# Patient Record
Sex: Male | Born: 2000 | Race: Black or African American | Hispanic: No | Smoking: Current some day smoker
Health system: Southern US, Community
[De-identification: ages and names within clinical notes are randomized; demographics above are authoritative.]

## PROBLEM LIST (undated history)

## (undated) DIAGNOSIS — F32A Depression, unspecified: Secondary | ICD-10-CM

## (undated) DIAGNOSIS — F4310 Post-traumatic stress disorder, unspecified: Secondary | ICD-10-CM

## (undated) HISTORY — DX: Depression, unspecified: F32.A

## (undated) HISTORY — DX: Post-traumatic stress disorder, unspecified: F43.10

---
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
## 2018-06-17 ENCOUNTER — Other Ambulatory Visit: Payer: Self-pay

## 2018-06-17 ENCOUNTER — Emergency Department (HOSPITAL_COMMUNITY): Payer: Medicaid Other

## 2018-06-17 ENCOUNTER — Emergency Department (HOSPITAL_COMMUNITY)
Admission: EM | Admit: 2018-06-17 | Discharge: 2018-06-18 | Disposition: A | Discharge status: Home / Self Care | Payer: Medicaid Other | Attending: Emergency Medicine | Admitting: Emergency Medicine

## 2018-06-17 ENCOUNTER — Encounter (HOSPITAL_COMMUNITY): Payer: Self-pay | Admitting: Emergency Medicine

## 2018-06-17 DIAGNOSIS — Y9367 Activity, basketball: Secondary | ICD-10-CM | POA: Insufficient documentation

## 2018-06-17 DIAGNOSIS — Y998 Other external cause status: Secondary | ICD-10-CM | POA: Insufficient documentation

## 2018-06-17 DIAGNOSIS — Y929 Unspecified place or not applicable: Secondary | ICD-10-CM | POA: Insufficient documentation

## 2018-06-17 DIAGNOSIS — S63432A Traumatic rupture of volar plate of right middle finger at metacarpophalangeal and interphalangeal joint, initial encounter: Secondary | ICD-10-CM | POA: Insufficient documentation

## 2018-06-17 DIAGNOSIS — S63639A Sprain of interphalangeal joint of unspecified finger, initial encounter: Secondary | ICD-10-CM

## 2018-06-17 DIAGNOSIS — S6991XA Unspecified injury of right wrist, hand and finger(s), initial encounter: Secondary | ICD-10-CM | POA: Diagnosis present

## 2018-06-17 DIAGNOSIS — W501XXA Accidental kick by another person, initial encounter: Secondary | ICD-10-CM | POA: Diagnosis not present

## 2018-06-17 NOTE — ED Triage Notes (Signed)
Patient injured his right middle finger while playing basketball this evening , presents with pain/swelling at right middle finger/skin intact .

## 2018-06-18 NOTE — Discharge Instructions (Signed)
You have a very small fracture of your finger.  This should heal with splinting and rest.  Please follow-up with your doctor in 1 to 2 weeks to ensure that healing is occurring.

## 2018-06-18 NOTE — ED Notes (Signed)
See providers notes

## 2018-06-18 NOTE — ED Provider Notes (Signed)
[facility] EMERGENCY DEPARTMENT Provider Note   CSN: [id number] Arrival date & time: 06/17/18  2255     History   Chief Complaint Chief Complaint  Patient presents with  . Finger Injury    HPI Randy Stone is a 17 y.o. male.  Patient presents to the emergency department with a chief complaint of right middle finger injury.  He states that he had his finger kicked and jammed while playing basketball earlier this evening.  He complains of moderate pain.  The pain is worsened with palpation and movement.  He denies numbness or weakness.  Denies any other injuries.  He is tried nothing prior to arrival.  The history is provided by the patient. No language interpreter was used.    History reviewed. No pertinent past medical history.  There are no active problems to display for this patient.   History reviewed. No pertinent surgical history.      Home Medications    Prior to Admission medications   Medication Sig Start Date End Date Taking? Authorizing Provider  ibuprofen (ADVIL,MOTRIN) 400 MG tablet Take 1 tablet (400 mg total) by mouth every 6 (six) hours as needed. 06/23/16   [name], MD    Family History No family history on file.  Social History Social History   Tobacco Use  . Smoking status: Never Smoker  . Smokeless tobacco: Never Used  Substance Use Topics  . Alcohol use: No  . Drug use: Yes    Types: Marijuana     Allergies   Patient has no known allergies.   Review of Systems Review of Systems  All other systems reviewed and are negative.    Physical Exam Updated Vital Signs BP 131/73 (BP Location: Right Arm)   Pulse 67   Temp 98.3 F (36.8 C) (Oral)   Resp 16   SpO2 100%   Physical Exam  Constitutional: He is oriented to person, place, and time. He appears well-developed and well-nourished.  HENT:  Head: Normocephalic and atraumatic.  Eyes: Conjunctivae and EOM are normal.  Neck: Normal range of  motion.  Cardiovascular: Normal rate.  Pulmonary/Chest: Effort normal.  Abdominal: He exhibits no distension.  Musculoskeletal: Normal range of motion.  Moderate swelling about the right third finger at the PIP with tenderness to palpation, no obvious dislocation  Neurological: He is alert and oriented to person, place, and time.  Skin: Skin is dry.  Psychiatric: He has a normal mood and affect. His behavior is normal. Judgment and thought content normal.  Nursing note and vitals reviewed.    ED Treatments / Results  Labs (all labs ordered are listed, but only abnormal results are displayed) Labs Reviewed - No data to display  EKG None  Radiology Dg Finger Middle Right  Result Date: 06/17/2018 CLINICAL DATA:  Basketball injury to the right middle finger this evening. Pain and swelling. EXAM: RIGHT MIDDLE FINGER 2+V COMPARISON:  None. FINDINGS: Soft tissue swelling over the proximal interphalangeal joint region of the right third finger. Displaced volar plate injury at the proximal aspect of the middle phalanx. No dislocation at the joint. Joint spaces are preserved. IMPRESSION: Displaced volar plate fracture at the proximal interphalangeal joint of the right third finger with associated soft tissue swelling. Electronically Signed   By: Burman Nieves M.D.   On: 06/17/2018 23:51    Procedures Procedures (including critical care time)  Medications Ordered in ED Medications - No data to display   Initial Impression / Assessment  and Plan / ED Course  I have reviewed the triage vital signs and the nursing notes.  Pertinent labs & imaging results that were available during my care of the patient were reviewed by me and considered in my medical decision making (see chart for details).    Patient with jammed finger.  He has a volar plate injury.  We will splint.  Recommend PCP follow-up.  Final Clinical Impressions(s) / ED Diagnoses   Final diagnoses:  Volar plate injury of  finger, initial encounter    ED Discharge Orders    None       Roxy HorsemanBrowning, Layann Bluett, PA-C 06/18/18 0117    Palumbo, April, MD 06/18/18 0120

## 2018-06-18 NOTE — ED Notes (Signed)
Patient verbalizes understanding of discharge instructions. Opportunity for questioning and answers were provided. Armband removed by staff, pt discharged from ED to home via POV  

## 2018-10-13 ENCOUNTER — Emergency Department (HOSPITAL_COMMUNITY)
Admission: EM | Admit: 2018-10-13 | Discharge: 2018-10-13 | Disposition: A | Discharge status: Home / Self Care | Payer: Medicaid Other | Attending: Emergency Medicine | Admitting: Emergency Medicine

## 2018-10-13 DIAGNOSIS — Z79899 Other long term (current) drug therapy: Secondary | ICD-10-CM | POA: Insufficient documentation

## 2018-10-13 DIAGNOSIS — R110 Nausea: Secondary | ICD-10-CM | POA: Diagnosis present

## 2018-10-13 DIAGNOSIS — F12188 Cannabis abuse with other cannabis-induced disorder: Secondary | ICD-10-CM | POA: Insufficient documentation

## 2018-10-13 LAB — URINALYSIS, ROUTINE W REFLEX MICROSCOPIC
Bilirubin Urine: NEGATIVE
Glucose, UA: NEGATIVE mg/dL
Hgb urine dipstick: NEGATIVE
Ketones, ur: 80 mg/dL — AB
Leukocytes, UA: NEGATIVE
Nitrite: NEGATIVE
Protein, ur: 30 mg/dL — AB
Specific Gravity, Urine: 1.032 — ABNORMAL HIGH (ref 1.005–1.030)
pH: 6 (ref 5.0–8.0)

## 2018-10-13 LAB — COMPREHENSIVE METABOLIC PANEL
ALT: 18 U/L (ref 0–44)
AST: 28 U/L (ref 15–41)
Albumin: 4.7 g/dL (ref 3.5–5.0)
Alkaline Phosphatase: 66 U/L (ref 38–126)
Anion gap: 16 — ABNORMAL HIGH (ref 5–15)
BUN: 13 mg/dL (ref 6–20)
CO2: 20 mmol/L — ABNORMAL LOW (ref 22–32)
Calcium: 10 mg/dL (ref 8.9–10.3)
Chloride: 104 mmol/L (ref 98–111)
Creatinine, Ser: 1.24 mg/dL (ref 0.61–1.24)
GFR calc Af Amer: >60 mL/min (ref >60)
GFR calc non Af Amer: >60 mL/min (ref >60)
Glucose, Bld: 93 mg/dL (ref 70–99)
Potassium: 4.7 mmol/L (ref 3.5–5.1)
Sodium: 140 mmol/L (ref 135–145)
Total Bilirubin: 2 mg/dL — ABNORMAL HIGH (ref 0.3–1.2)
Total Protein: 7.7 g/dL (ref 6.5–8.1)

## 2018-10-13 LAB — CBC WITH DIFFERENTIAL/PLATELET
Abs Immature Granulocytes: 0 10*3/uL (ref 0.00–0.07)
Basophils Absolute: 0 10*3/uL (ref 0.0–0.1)
Basophils Relative: 0 %
Eosinophils Absolute: 0 10*3/uL (ref 0.0–0.5)
Eosinophils Relative: 0 %
HCT: 40.3 % (ref 39.0–52.0)
HEMOGLOBIN: 13.3 g/dL (ref 13.0–17.0)
LYMPHS ABS: 0.9 10*3/uL (ref 0.7–4.0)
LYMPHS PCT: 11 %
MCH: 25.4 pg — ABNORMAL LOW (ref 26.0–34.0)
MCHC: 33 g/dL (ref 30.0–36.0)
MCV: 77.1 fL — ABNORMAL LOW (ref 80.0–100.0)
MONOS PCT: 4 %
Monocytes Absolute: 0.3 10*3/uL (ref 0.1–1.0)
Neutro Abs: 6.7 10*3/uL (ref 1.7–7.7)
Neutrophils Relative %: 85 %
Platelets: 157 10*3/uL (ref 150–400)
RBC: 5.23 MIL/uL (ref 4.22–5.81)
RDW: 12.8 % (ref 11.5–15.5)
WBC: 7.9 10*3/uL (ref 4.0–10.5)
nRBC: 0 % (ref 0.0–0.2)
nRBC: 0 /100 WBC

## 2018-10-13 LAB — LIPASE, BLOOD: Lipase: 25 U/L (ref 11–51)

## 2018-10-13 MED ORDER — SODIUM CHLORIDE 0.9 % IV BOLUS
2000.0000 mL | Freq: Once | INTRAVENOUS | Status: AC
  Administered 2018-10-13: 1000 mL via INTRAVENOUS

## 2018-10-13 MED ORDER — ONDANSETRON HCL 4 MG/2ML IJ SOLN
4.0000 mg | Freq: Once | INTRAMUSCULAR | Status: AC
  Administered 2018-10-13: 4 mg via INTRAVENOUS
  Filled 2018-10-13: qty 2

## 2018-10-13 MED ORDER — ONDANSETRON 4 MG PO TBDP: 4.0000 mg | ORAL_TABLET | Freq: Three times a day (TID) | ORAL | 0 refills | Status: DC | PRN

## 2018-10-13 NOTE — ED Notes (Signed)
Patient has been provided with gatorade for fluid challenge.  No reports of emesis since receiving zofran.

## 2018-10-13 NOTE — ED Provider Notes (Signed)
[facility]ENC[facility] Provider Note   CSN: [id number] Arrival date & time: 10/13/18  1351     History   Chief Complaint Chief Complaint  Patient presents with  . Nausea    HPI Henreitta Leberaziah J Bohman is a 18 y.o. male.  18 year old male with history of cannabis hyperemesis  syndrome, presents with persistent nausea and vomiting.  Patient was recently admitted in [place] 2 weeks ago for intractable vomiting and dehydration for the same.  Patient reports he was smoking marijuana multiple times per day while in [place].  Had several ED presentations there and received nausea medicines and fluids.  He reports on third visit to the ED he was hospitalized for dehydration.  He has not had fever.  No watery diarrhea.   [place] [facility] [place] and was vomiting on day of his return.  Had been doing well for approximately 1 week but nausea and vomiting returned yesterday.  Still using marijuana but reports now once per day us. Reports he is having multiple episodes of emesis per day.  Reports it is difficult for him to pass a bowel movement and sometimes it is slightly loose when he is able to pass some stool.  Reports mild upper abdominal pain that feels mostly like nausea and "queasiness".  No dysuria.  No testicular pain.  He is on no chronic medications. No prior abdominal surgeries.  The history is provided by the patient.    No past medical history on file.  There are no active problems to display for this patient.   No past surgical history on file.      Home Medications    Prior to Admission medications   Medication Sig Start Date End Date Taking? Authorizing Provider  ibuprofen (ADVIL,MOTRIN) 400 MG tablet Take 1 tablet (400 mg total) by mouth every 6 (six) hours as needed. 06/23/16   Gwyneth SproutPlunkett, [name], MD  ondansetron (ZOFRAN ODT) 4 MG disintegrating tablet Take 1 tablet (4 mg total) by mouth every 8 (eight) hours as needed for nausea or  vomiting. 10/13/18   Ree Shayeis, Jilberto Vanderwall, MD    Family History No family history on file.  Social History Social History   Tobacco Use  . Smoking status: Never Smoker  . Smokeless tobacco: Never Used  Substance Use Topics  . Alcohol use: No  . Drug use: Yes    Types: Marijuana     Allergies   Patient has no known allergies.   Review of Systems Review of Systems  All systems reviewed and were reviewed and were negative except as stated in the HPI  Physical Exam Updated Vital Signs BP 113/65 (BP Location: Right Arm)   Pulse 97   Temp 97.8 F (36.6 C) (Oral)   Resp 14   SpO2 100%   Physical Exam Vitals signs and nursing note reviewed.  Constitutional:      General: He is not in acute distress.    Appearance: He is well-developed.     Comments: Actively vomiting and dry heaving during assessment  HENT:     Head: Normocephalic and atraumatic.     Nose: Nose normal.  Eyes:     Conjunctiva/sclera: Conjunctivae normal.     Pupils: Pupils are equal, round, and reactive to light.  Neck:     Musculoskeletal: Normal range of motion and neck supple.  Cardiovascular:     Rate and Rhythm: Normal rate and regular rhythm.     Heart sounds: Normal  heart sounds. No murmur. No friction rub. No gallop.   Pulmonary:     Effort: Pulmonary effort is normal. No respiratory distress.     Breath sounds: Normal breath sounds. No wheezing or rales.  Abdominal:     General: Bowel sounds are normal.     Palpations: Abdomen is soft.     Tenderness: There is abdominal tenderness. There is no guarding or rebound.     Comments: Soft and nondistended, mild epigastric tenderness, no lower abdominal tenderness, no guarding or peritoneal signs, no hernias  Genitourinary:    Penis: Normal.      Comments: Testicles normal bilaterally, no scrotal swelling or tenderness, no inguinal hernias Skin:    General: Skin is warm and dry.     Capillary Refill: Capillary refill takes less than 2 seconds.      Findings: No rash.  Neurological:     General: No focal deficit present.     Mental Status: He is alert and oriented to person, place, and time.     Cranial Nerves: No cranial nerve deficit.     Comments: Normal strength 5/5 in upper and lower extremities      ED Treatments / Results  Labs (all labs ordered are listed, but only abnormal results are displayed) Labs Reviewed  COMPREHENSIVE METABOLIC PANEL - Abnormal; Notable for the following components:      Result Value   CO2 20 (*)    Total Bilirubin 2.0 (*)    Anion gap 16 (*)    All other components within normal limits  CBC WITH DIFFERENTIAL/PLATELET - Abnormal; Notable for the following components:   MCV 77.1 (*)    MCH 25.4 (*)    All other components within normal limits  URINALYSIS, ROUTINE W REFLEX MICROSCOPIC - Abnormal; Notable for the following components:   Specific Gravity, Urine 1.032 (*)    Ketones, ur 80 (*)    Protein, ur 30 (*)    Bacteria, UA RARE (*)    All other components within normal limits  LIPASE, BLOOD    EKG None  Radiology No results found.  Procedures Procedures (including critical care time)  Medications Ordered in ED Medications  ondansetron (ZOFRAN) injection 4 mg (4 mg Intravenous Given 10/13/18 1440)  sodium chloride 0.9 % bolus 2,000 mL (0 mLs Intravenous Stopped 10/13/18 1730)     Initial Impression / Assessment and Plan / ED Course  I have reviewed the triage vital signs and the nursing notes.  Pertinent labs & imaging results that were available during my care of the patient were reviewed by me and considered in my medical decision making (see chart for details).    18 year old male with history of chronic marijuana use and recent admission 2 weeks ago in [place] for persistent vomiting consistent with cannabis hyperemesis syndrome, returns to the ED today for nausea and vomiting.  Symptoms returned yesterday.  No associated fever or diarrhea.  On exam here afebrile with normal  vitals.  Throat benign, lungs clear with symmetric breath sounds.  Abdomen with mild epigastric tenderness but no guarding or peritoneal signs.  No right lower quadrant tenderness.  GU exam normal.  We will give to IV fluid boluses here and check labs to include CBC CMP lipase.  Will give IV Zofran and reassess.  4pm: After zofran, no further vomiting, IVF infusion. Labs pending.   5pm: Patient reports feeling much better after Zofran and IV fluids.  Still no further vomiting.  Will give fluid trial.  CMP with normal electrolytes, bicarb 20, glucose 93.  BUN 13, creatinine is upper limit of normal for age at 1.24 likely from dehydration but will send urinalysis.  Lipase normal.  CBC pending.  Signed out to Dr. Hardie Pulleyalder at end of shift. If urine reassuring and tolerates fluid trial. Anticipate can be discharged with Zofran for as needed use.  Counseled patient and family at length on the effects of cannabis hyperemesis syndrome and strongly advised he discontinue use of cannabis.  Also advised PCP follow-up in 1 week to recheck creatinine.  Final Clinical Impressions(s) / ED Diagnoses   Final diagnoses:  Cannabis hyperemesis syndrome concurrent with and due to cannabis abuse [facility](HCC)    ED Discharge Orders         Ordered    ondansetron (ZOFRAN ODT) 4 MG disintegrating tablet  Every 8 hours PRN     10/13/18 1722           Ree Shayeis, Kitzia Camus, MD 10/13/18 2119

## 2018-10-13 NOTE — Discharge Instructions (Signed)
We strongly advise that you discontinue all use of marijuana as this is a frequent cause of cyclical vomiting as you are experiencing.  You had dehydration today which also had effect on your kidney.  Rest and drink plenty of fluids over the next few days.  May use Zofran as needed for any additional vomiting.  Follow-up with your regular doctor in 1 week to have your kidney tests called the creatinine repeated.  Return sooner for repetitive vomiting with inability to keep down fluids, worsen abdominal pain or new concerns.

## 2018-10-13 NOTE — ED Notes (Signed)
Attempted to obtain labs with no success on drawback.  MD made aware.  Lipase and CMP obtained and sent to lab.

## 2018-10-13 NOTE — ED Triage Notes (Signed)
Pt in c/o n/v/d onset today with hx of the same, pt reports recent hospitalization for similar symptoms, pt reports 15-20 vomiting episodes today, pt reports use of marijuana, A&O x4

## 2019-01-22 ENCOUNTER — Other Ambulatory Visit: Payer: Self-pay

## 2019-01-22 ENCOUNTER — Encounter (HOSPITAL_COMMUNITY): Payer: Self-pay | Admitting: Obstetrics and Gynecology

## 2019-01-22 ENCOUNTER — Emergency Department (HOSPITAL_COMMUNITY)
Admission: EM | Admit: 2019-01-22 | Discharge: 2019-01-22 | Disposition: A | Discharge status: Home / Self Care | Payer: Medicaid Other | Attending: Emergency Medicine | Admitting: Emergency Medicine

## 2019-01-22 DIAGNOSIS — F12188 Cannabis abuse with other cannabis-induced disorder: Secondary | ICD-10-CM | POA: Insufficient documentation

## 2019-01-22 DIAGNOSIS — R112 Nausea with vomiting, unspecified: Secondary | ICD-10-CM | POA: Diagnosis present

## 2019-01-22 LAB — CBC WITH DIFFERENTIAL/PLATELET
Abs Immature Granulocytes: 0.02 10*3/uL (ref 0.00–0.07)
Basophils Absolute: 0.1 10*3/uL (ref 0.0–0.1)
Basophils Relative: 1 %
Eosinophils Absolute: 0 10*3/uL (ref 0.0–0.5)
Eosinophils Relative: 0 %
HCT: 45.8 % (ref 39.0–52.0)
Hemoglobin: 15.7 g/dL (ref 13.0–17.0)
Immature Granulocytes: 0 %
Lymphocytes Relative: 13 %
Lymphs Abs: 1 10*3/uL (ref 0.7–4.0)
MCH: 26.7 pg (ref 26.0–34.0)
MCHC: 34.3 g/dL (ref 30.0–36.0)
MCV: 77.9 fL — ABNORMAL LOW (ref 80.0–100.0)
Monocytes Absolute: 0.3 10*3/uL (ref 0.1–1.0)
Monocytes Relative: 3 %
Neutro Abs: 6.7 10*3/uL (ref 1.7–7.7)
Neutrophils Relative %: 83 %
Platelets: 201 10*3/uL (ref 150–400)
RBC: 5.88 MIL/uL — ABNORMAL HIGH (ref 4.22–5.81)
RDW: 13.5 % (ref 11.5–15.5)
WBC: 8.1 10*3/uL (ref 4.0–10.5)
nRBC: 0 % (ref 0.0–0.2)

## 2019-01-22 LAB — COMPREHENSIVE METABOLIC PANEL
ALT: 29 U/L (ref 0–44)
AST: 21 U/L (ref 15–41)
Albumin: 4.6 g/dL (ref 3.5–5.0)
Alkaline Phosphatase: 66 U/L (ref 38–126)
Anion gap: 8 (ref 5–15)
BUN: 12 mg/dL (ref 6–20)
CO2: 27 mmol/L (ref 22–32)
Calcium: 9.4 mg/dL (ref 8.9–10.3)
Chloride: 102 mmol/L (ref 98–111)
Creatinine, Ser: 0.84 mg/dL (ref 0.61–1.24)
GFR calc Af Amer: >60 mL/min (ref >60)
GFR calc non Af Amer: >60 mL/min (ref >60)
Glucose, Bld: 89 mg/dL (ref 70–99)
Potassium: 3.9 mmol/L (ref 3.5–5.1)
Sodium: 137 mmol/L (ref 135–145)
Total Bilirubin: 0.9 mg/dL (ref 0.3–1.2)
Total Protein: 7.8 g/dL (ref 6.5–8.1)

## 2019-01-22 LAB — URINALYSIS, ROUTINE W REFLEX MICROSCOPIC
Bilirubin Urine: NEGATIVE
Glucose, UA: NEGATIVE mg/dL
Hgb urine dipstick: NEGATIVE
Ketones, ur: 5 mg/dL — AB
Leukocytes,Ua: NEGATIVE
Nitrite: NEGATIVE
Protein, ur: NEGATIVE mg/dL
Specific Gravity, Urine: 1.013 (ref 1.005–1.030)
pH: 9 — ABNORMAL HIGH (ref 5.0–8.0)

## 2019-01-22 LAB — LIPASE, BLOOD: Lipase: 24 U/L (ref 11–51)

## 2019-01-22 MED ORDER — ONDANSETRON 4 MG PO TBDP: 4.0000 mg | ORAL_TABLET | Freq: Three times a day (TID) | ORAL | 0 refills | Status: DC | PRN

## 2019-01-22 MED ORDER — SODIUM CHLORIDE 0.9 % IV BOLUS
1000.0000 mL | Freq: Once | INTRAVENOUS | Status: AC
  Administered 2019-01-22: 1000 mL via INTRAVENOUS

## 2019-01-22 NOTE — ED Triage Notes (Signed)
Per EMS: Pt is coming in for N/V after smoking weed last night. Pt alert and oriented.

## 2019-01-22 NOTE — ED Provider Notes (Signed)
[facility]-EMERGENCY DEPT Provider Note   CSN: [id number] Arrival date & time: 01/22/19  1132    History   Chief Complaint Chief Complaint  Patient presents with  . Nausea  . Emesis  . Headache    HPI Randy Stone is a 18 y.o. male.     18yo male brought in by EMS for nausea and vomiting since yesterday. Patient reports onset of symptoms after smoking marijuana and eating fried oreos. Denies abdominal pain, reports he has been constipated. Denies fevers or chills. No known sick contacts. No other complaints or concerns. Given Morphine and Zofran by EMS with some relief upon arrival in the ER.      No past medical history on file.  There are no active problems to display for this patient.   No past surgical history on file.      Home Medications    Prior to Admission medications   Medication Sig Start Date End Date Taking? Authorizing Provider  ondansetron (ZOFRAN ODT) 4 MG disintegrating tablet Take 1 tablet (4 mg total) by mouth every 8 (eight) hours as needed for nausea or vomiting. 01/22/19   Jeannie Fend, PA-C    Family History No family history on file.  Social History Social History   Tobacco Use  . Smoking status: Never Smoker  . Smokeless tobacco: Never Used  Substance Use Topics  . Alcohol use: No  . Drug use: Yes    Types: Marijuana     Allergies   Patient has no known allergies.   Review of Systems Review of Systems  Constitutional: Negative for chills and fever.  Gastrointestinal: Positive for constipation, nausea and vomiting. Negative for abdominal pain and diarrhea.  Genitourinary: Negative for enuresis, frequency and urgency.  Musculoskeletal: Negative for arthralgias and myalgias.  Skin: Negative for rash and wound.  Allergic/Immunologic: Negative for immunocompromised state.  Neurological: Positive for headaches. Negative for weakness.  Psychiatric/Behavioral: Negative for confusion.  All other  systems reviewed and are negative.    Physical Exam Updated Vital Signs BP 136/72   Pulse 74   Temp 98.1 F (36.7 C) (Oral)   Resp 18   SpO2 98%   Physical Exam Vitals signs and nursing note reviewed.  Constitutional:      General: He is not in acute distress.    Appearance: He is well-developed. He is not diaphoretic.  HENT:     Head: Normocephalic and atraumatic.     Mouth/Throat:     Mouth: Mucous membranes are moist.  Cardiovascular:     Rate and Rhythm: Normal rate and regular rhythm.     Heart sounds: Normal heart sounds. No murmur.  Pulmonary:     Effort: Pulmonary effort is normal.     Breath sounds: Normal breath sounds.  Abdominal:     General: Abdomen is flat.     Palpations: Abdomen is soft.     Tenderness: There is no abdominal tenderness.  Skin:    General: Skin is warm and dry.     Findings: No rash.  Neurological:     Mental Status: He is alert and oriented to person, place, and time.  Psychiatric:        Behavior: Behavior normal.      ED Treatments / Results  Labs (all labs ordered are listed, but only abnormal results are displayed) Labs Reviewed  URINALYSIS, ROUTINE W REFLEX MICROSCOPIC - Abnormal; Notable for the following components:      Result Value  pH 9.0 (*)    Ketones, ur 5 (*)    All other components within normal limits  CBC WITH DIFFERENTIAL/PLATELET  COMPREHENSIVE METABOLIC PANEL  LIPASE, BLOOD    EKG None  Radiology No results found.  Procedures Procedures (including critical care time)  Medications Ordered in ED Medications  sodium chloride 0.9 % bolus 1,000 mL (0 mLs Intravenous Stopped 01/22/19 1501)     Initial Impression / Assessment and Plan / ED Course  I have reviewed the triage vital signs and the nursing notes.  Pertinent labs & imaging results that were available during my care of the patient were reviewed by me and considered in my medical decision making (see chart for details).  Clinical Course  as of Jan 22 1511  Sat Jan 22, 2019  [id number] 18 year old male brought in by EMS for nausea and vomiting after smoking marijuana.  Patient has a history of similar episodes previously.  Patient was given Zofran and morphine by EMS, no longer vomiting upon arrival.  Patient denies fevers or abdominal pain, changes in bowel habits.  On exam patient is well-appearing, abdomen is soft and nontender.  Patient was given IV fluids, awaiting lab evaluation.   [LM]  1511 Care signed out at change of shift pending labs.   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

## 2019-01-22 NOTE — ED Notes (Signed)
PT reports that he has vomited 3 x in the past 24 hours, with some associated diarrhea. Pt also c/o upper middle abdominal "discomfort", " feeling like i needs to eat".

## 2019-01-22 NOTE — Discharge Instructions (Signed)
Take Zofran as needed as prescribed for nausea and vomiting. Stop smoking marijuana to prevent future vomiting episodes.

## 2019-01-22 NOTE — ED Provider Notes (Signed)
Care transferred at shift change from [facility]. See note for Full HPI.  In summation, 18 year old male brought in by EMS for nausea and vomiting after smoking marijuana.  Had similar episodes of emesis after THC use.  Given morphine and Zofran by EMS.  Patient without any episodes of emesis in department.  He denies any fevers, abdominal pain, chest pain, shortness of breath, dysuria, diarrhea.  Patient had soft, nontender abdomen.  Nonsurgical abdomen.  Patient given IV fluids.  Plan for labs, urine.  Per previous provider likely cannabinoid hyperemesis.  Plan for DC home if normal labs.  Zofran sent by previous provider department to pharmacy.  Physical Exam  BP (!) 131/92   Pulse 61   Temp 98.1 F (36.7 C) (Oral)   Resp 16   SpO2 99%   Physical Exam Vitals signs and nursing note reviewed.  Constitutional:      General: He is not in acute distress.    Appearance: He is well-developed. He is not diaphoretic.  HENT:     Head: Atraumatic.  Eyes:     Pupils: Pupils are equal, round, and reactive to light.  Neck:     Musculoskeletal: Normal range of motion and neck supple.  Cardiovascular:     Rate and Rhythm: Normal rate and regular rhythm.  Pulmonary:     Effort: Pulmonary effort is normal. No respiratory distress.  Abdominal:     General: There is no distension.     Palpations: Abdomen is soft.     Comments: Soft, non tender without rebound or guarding. Normoactive bowel sounds. No overlying skin changes to abd wall. No abd wall herniations.  Musculoskeletal: Normal range of motion.  Skin:    General: Skin is warm and dry.  Neurological:     Mental Status: He is alert.     ED Course/Procedures   Clinical Course as of Jan 21 1553  Sat Jan 22, 2019  [id number] 18 year old male brought in by EMS for nausea and vomiting after smoking marijuana.  Patient has a history of similar episodes previously.  Patient was given Zofran and morphine by EMS, no longer vomiting upon arrival.   Patient denies fevers or abdominal pain, changes in bowel habits.  On exam patient is well-appearing, abdomen is soft and nontender.  Patient was given IV fluids, awaiting lab evaluation.   [LM]  1511 Care signed out at change of shift pending labs.   [LM]    Clinical Course User Index [LM] Jeannie Fend, PA-C    Procedures Labs Reviewed  CBC WITH DIFFERENTIAL/PLATELET - Abnormal; Notable for the following components:      Result Value   RBC 5.88 (*)    MCV 77.9 (*)    All other components within normal limits  URINALYSIS, ROUTINE W REFLEX MICROSCOPIC - Abnormal; Notable for the following components:   pH 9.0 (*)    Ketones, ur 5 (*)    All other components within normal limits  COMPREHENSIVE METABOLIC PANEL  LIPASE, BLOOD   MDM  18 year old male presents for evaluation nausea and emesis after using THC.  Care transferred from previous provider at shift change.  Patient without episodes of emesis in department.  Previous episodes of nonbloody, nonbilious.  Abdomen soft.  Nontender, nonsurgical abdomen.  Plan for labs, urine, IV fluids and reevaluate.  1550: Labs personally reviewed: CBC: Without leukocytosis, hemoglobin 15.7 CMP: Without electrolyte, renal or liver abnormality Lipase: 24 Urinalysis: No evidence of UTI, ketonuria, likely 2/2/ dehydration from emesis.  On reevaluation abdomen soft, nontender without rebound or guarding.  Non surgical abdomen.  Patient tolerating p.o. intake in department without difficulty.  He has had no episodes of emesis in department. Patient is nontoxic, nonseptic appearing, in no apparent distress.  Patient's pain and other symptoms adequately managed in emergency department.  Fluid bolus given.  Labs, imaging and vitals reviewed.  Patient does not meet the SIRS or Sepsis criteria.  On repeat exam patient does not have a surgical abdomin and there are no peritoneal signs.  No indication of appendicitis, bowel obstruction, bowel perforation,  cholecystitis, diverticulitis.  Patient discharged home with symptomatic treatment and given strict instructions for follow-up with their primary care physician.  I have also discussed reasons to return immediately to the ER.  Patient expresses understanding and agrees with plan.   1. Cannabinoid hyperemesis     Revel Stellmach A, PA-C 01/22/19 1554    Virgina NorfolkCuratolo, Adam, DO 01/22/19 1628

## 2019-04-22 ENCOUNTER — Emergency Department (HOSPITAL_COMMUNITY)
Admission: EM | Admit: 2019-04-22 | Discharge: 2019-04-22 | Disposition: A | Discharge status: Home / Self Care | Payer: Medicaid Other | Attending: Emergency Medicine | Admitting: Emergency Medicine

## 2019-04-22 DIAGNOSIS — F1210 Cannabis abuse, uncomplicated: Secondary | ICD-10-CM | POA: Diagnosis not present

## 2019-04-22 DIAGNOSIS — F329 Major depressive disorder, single episode, unspecified: Secondary | ICD-10-CM | POA: Diagnosis not present

## 2019-04-22 DIAGNOSIS — R112 Nausea with vomiting, unspecified: Secondary | ICD-10-CM | POA: Diagnosis not present

## 2019-04-22 DIAGNOSIS — F32A Depression, unspecified: Secondary | ICD-10-CM

## 2019-04-22 MED ORDER — SODIUM CHLORIDE 0.9 % IV SOLN
INTRAVENOUS | Status: DC
  Administered 2019-04-22: 10:00:00 via INTRAVENOUS

## 2019-04-22 MED ORDER — ONDANSETRON HCL 8 MG PO TABS: 8.0000 mg | ORAL_TABLET | Freq: Three times a day (TID) | ORAL | 0 refills | Status: DC | PRN

## 2019-04-22 MED ORDER — CAPSAICIN 0.075 % EX CREA
TOPICAL_CREAM | Freq: Two times a day (BID) | CUTANEOUS | Status: DC
  Administered 2019-04-22: 10:00:00 via TOPICAL
  Filled 2019-04-22: qty 60

## 2019-04-22 MED ORDER — SODIUM CHLORIDE 0.9 % IV BOLUS
500.0000 mL | Freq: Once | INTRAVENOUS | Status: AC
  Administered 2019-04-22: 500 mL via INTRAVENOUS

## 2019-04-22 MED ORDER — HALOPERIDOL LACTATE 5 MG/ML IJ SOLN
2.0000 mg | Freq: Once | INTRAMUSCULAR | Status: AC
  Administered 2019-04-22: 2 mg via INTRAVENOUS
  Filled 2019-04-22: qty 1

## 2019-04-22 NOTE — ED Notes (Signed)
Patient ambulated to bathroom with no assistance.

## 2019-04-22 NOTE — ED Notes (Signed)
EDP at bedside  

## 2019-04-22 NOTE — ED Provider Notes (Signed)
[facility]-EMERGENCY DEPT Provider Note   CSN: [id number] Arrival date & time: 04/22/19  0800    History   Chief Complaint Chief Complaint  Patient presents with  . Emesis    HPI Randy Stone is a 18 y.o. male.     HPI   He presents for evaluation nausea and vomiting, on and off since yesterday evening.  He also is sad and having episodes of crying because his family members, "do not like me."  He states that recently he got a new job and no one congratulated him and that made him feel bad.  He states he smokes marijuana to improve his outlook on life.  He understands that it could be causing his nausea and vomiting.  He denies suicidal ideation.  There are no other known modifying factors.  No past medical history on file.  There are no active problems to display for this patient.   No past surgical history on file.      Home Medications    Prior to Admission medications   Medication Sig Start Date End Date Taking? Authorizing Provider  ondansetron (ZOFRAN ODT) 4 MG disintegrating tablet Take 1 tablet (4 mg total) by mouth every 8 (eight) hours as needed for nausea or vomiting. Patient not taking: Reported on 04/22/2019 01/22/19   Army MeliaMurphy, Laura A, PA-C  ondansetron [facility](ZOFRAN) 8 MG tablet Take 1 tablet (8 mg total) by mouth every 8 (eight) hours as needed for nausea or vomiting. 04/22/19   Mancel BaleWentz, Dashel Goines, MD    Family History No family history on file.  Social History Social History   Tobacco Use  . Smoking status: Never Smoker  . Smokeless tobacco: Never Used  Substance Use Topics  . Alcohol use: No  . Drug use: Yes    Types: Marijuana     Allergies   Patient has no known allergies.   Review of Systems Review of Systems  All other systems reviewed and are negative.    Physical Exam Updated Vital Signs BP 121/71   Pulse (!) 51   Temp (!) 97.5 F (36.4 C) (Oral)   Resp 16   Ht 6' (1.829 m)   Wt 59 kg   SpO2 100%   BMI  17.63 kg/m   Physical Exam Vitals signs and nursing note reviewed.  Constitutional:      General: He is not in acute distress.    Appearance: Normal appearance. He is well-developed. He is not ill-appearing, toxic-appearing or diaphoretic.  HENT:     Head: Normocephalic and atraumatic.     Right Ear: External ear normal.     Left Ear: External ear normal.  Eyes:     Conjunctiva/sclera: Conjunctivae normal.     Pupils: Pupils are equal, round, and reactive to light.  Neck:     Musculoskeletal: Normal range of motion and neck supple.     Trachea: Phonation normal.  Cardiovascular:     Rate and Rhythm: Normal rate and regular rhythm.     Heart sounds: Normal heart sounds.  Pulmonary:     Effort: Pulmonary effort is normal.     Breath sounds: Normal breath sounds.  Abdominal:     Palpations: Abdomen is soft.     Tenderness: There is no abdominal tenderness.  Musculoskeletal: Normal range of motion.  Skin:    General: Skin is warm and dry.  Neurological:     Mental Status: He is alert and oriented to person, place, and time.  Cranial Nerves: No cranial nerve deficit.     Sensory: No sensory deficit.     Motor: No abnormal muscle tone.     Coordination: Coordination normal.  Psychiatric:        Behavior: Behavior normal.        Thought Content: Thought content normal.        Judgment: Judgment normal.     Comments: Sad and tearful during evaluation.      ED Treatments / Results  Labs (all labs ordered are listed, but only abnormal results are displayed) Labs Reviewed - No data to display  EKG None  Radiology No results found.  Procedures Procedures (including critical care time)  Medications Ordered in ED Medications  0.9 %  sodium chloride infusion ( Intravenous New Bag/Given 04/22/19 0936)  capsicum (ZOSTRIX) 0.075 % cream ( Topical Given 04/22/19 0932)  sodium chloride 0.9 % bolus 500 mL (0 mLs Intravenous Stopped 04/22/19 0930)  haloperidol lactate  (HALDOL) injection 2 mg (2 mg Intravenous Given 04/22/19 0842)     Initial Impression / Assessment and Plan / ED Course  I have reviewed the triage vital signs and the nursing notes.  Pertinent labs & imaging results that were available during my care of the patient were reviewed by me and considered in my medical decision making (see chart for details).         Patient Vitals for the past 24 hrs:  BP Temp Temp src Pulse Resp SpO2 Height Weight  04/22/19 1030 121/71 - - (!) 51 16 100 % - -  04/22/19 1000 (!) 100/51 - - - - - - -  04/22/19 0931 (!) 97/55 - - 62 16 98 % - -  04/22/19 0830 (!) 97/52 - - (!) 51 15 100 % - -  04/22/19 0815 109/71 (!) 97.5 F (36.4 C) Oral (!) 48 15 100 % 6' (1.829 m) 59 kg  04/22/19 0807 - - - - - 97 % - -    10:57 AM Reevaluation with update and discussion. After initial assessment and treatment, an updated evaluation reveals he states he feels better is tolerating liquids without vomiting at this time.  He appears less uncomfortable and as depressed at this time.  Plan is discussed and questions answered. Daleen Bo   Medical Decision Making: Recurrent nausea vomiting possibly consistent with hyperemesis cannabinoid syndrome.  Patient is nontoxic and improved with treatment.  He is stable for discharge.  Doubt serious bacterial infection, metabolic instability or impending vascular collapse.  CRITICAL CARE-no Performed by: Daleen Bo  Nursing Notes Reviewed/ Care Coordinated Applicable Imaging Reviewed Interpretation of Laboratory Data incorporated into ED treatment  The patient appears reasonably screened and/or stabilized for discharge and I doubt any other medical condition or other [facility] requiring further screening, evaluation, or treatment in the ED at this time prior to discharge.  Plan: Home Medications-OTC as needed; Home Treatments-avoid marijuana; return here if the recommended treatment, does not improve the symptoms; Recommended  follow up-follow-up with PCP and counselor of choice for ongoing management    Final Clinical Impressions(s) / ED Diagnoses   Final diagnoses:  Non-intractable vomiting with nausea, unspecified vomiting type  Depression, unspecified depression type  Marihuana abuse    ED Discharge Orders         Ordered    ondansetron (ZOFRAN) 8 MG tablet  Every 8 hours PRN     04/22/19 1102           Daleen Bo, MD 04/22/19  1103  

## 2019-04-22 NOTE — ED Triage Notes (Signed)
Pt to ED via EMS from home. Pt c/o n/v that started yesterday. Per EMS pt has hx of THC induced emesis. Pt reports smoking marijuana at time symptoms occurred.

## 2019-04-22 NOTE — ED Notes (Signed)
Pt discharged home per MD order. Discharge summary reviewed, pt verbalizes understanding. Voicing no complaints at discharge, ambulatory .

## 2019-04-22 NOTE — Discharge Instructions (Signed)
Congratulations on getting a job!  Use the capsaicin cream, which we gave you, by applying a small amount to the area around your bellybutton, 2 or 3 times a day.  Try to avoid using marijuana.  Start with a clear liquid diet-advance to regular foods over the next couple of days.  Consider using her resource guide, which is attached, to help you find a counselor to talk to about your feelings.  Return here, if needed, for problems.

## 2019-04-22 NOTE — ED Notes (Addendum)
During triage process, pt became tearful- informs this nurse he has been self medicating with marijuana. Reports he lives with his grandmother. Reports feeling stressed and sad because  he feels his family does not like him. Pt denies suicidal thoughts.  This nurse notified EDP Eulis Foster of pt behaviors.

## 2019-06-17 ENCOUNTER — Emergency Department (HOSPITAL_COMMUNITY): Payer: Medicaid Other

## 2019-06-17 ENCOUNTER — Encounter (HOSPITAL_COMMUNITY): Payer: Self-pay

## 2019-06-17 ENCOUNTER — Emergency Department (HOSPITAL_COMMUNITY)
Admission: EM | Admit: 2019-06-17 | Discharge: 2019-06-18 | Disposition: A | Discharge status: Home / Self Care | Payer: Medicaid Other | Attending: Emergency Medicine | Admitting: Emergency Medicine

## 2019-06-17 ENCOUNTER — Other Ambulatory Visit: Payer: Self-pay

## 2019-06-17 DIAGNOSIS — R112 Nausea with vomiting, unspecified: Secondary | ICD-10-CM | POA: Insufficient documentation

## 2019-06-17 DIAGNOSIS — R197 Diarrhea, unspecified: Secondary | ICD-10-CM | POA: Diagnosis not present

## 2019-06-17 DIAGNOSIS — F1210 Cannabis abuse, uncomplicated: Secondary | ICD-10-CM | POA: Insufficient documentation

## 2019-06-17 LAB — CBC WITH DIFFERENTIAL/PLATELET
Abs Immature Granulocytes: 0.03 10*3/uL (ref 0.00–0.07)
Basophils Absolute: 0 10*3/uL (ref 0.0–0.1)
Basophils Relative: 0 %
Eosinophils Absolute: 0 10*3/uL (ref 0.0–0.5)
Eosinophils Relative: 0 %
HCT: 45.3 % (ref 39.0–52.0)
Hemoglobin: 15.3 g/dL (ref 13.0–17.0)
Immature Granulocytes: 0 %
Lymphocytes Relative: 20 %
Lymphs Abs: 2.5 10*3/uL (ref 0.7–4.0)
MCH: 26 pg (ref 26.0–34.0)
MCHC: 33.8 g/dL (ref 30.0–36.0)
MCV: 77 fL — ABNORMAL LOW (ref 80.0–100.0)
Monocytes Absolute: 0.7 10*3/uL (ref 0.1–1.0)
Monocytes Relative: 6 %
Neutro Abs: 9.4 10*3/uL — ABNORMAL HIGH (ref 1.7–7.7)
Neutrophils Relative %: 74 %
Platelets: 190 10*3/uL (ref 150–400)
RBC: 5.88 MIL/uL — ABNORMAL HIGH (ref 4.22–5.81)
RDW: 12.8 % (ref 11.5–15.5)
WBC: 12.7 10*3/uL — ABNORMAL HIGH (ref 4.0–10.5)
nRBC: 0 % (ref 0.0–0.2)

## 2019-06-17 MED ORDER — SODIUM CHLORIDE 0.9 % IV BOLUS (SEPSIS)
1000.0000 mL | Freq: Once | INTRAVENOUS | Status: AC
  Administered 2019-06-17: 1000 mL via INTRAVENOUS

## 2019-06-17 MED ORDER — ONDANSETRON HCL 4 MG/2ML IJ SOLN: 4.0000 mg | Freq: Once | INTRAMUSCULAR | Status: DC

## 2019-06-17 MED ORDER — SODIUM CHLORIDE 0.9 % IV SOLN: 1000.0000 mL | INTRAVENOUS | Status: DC

## 2019-06-17 MED ORDER — CAPSAICIN 0.025 % EX CREA
TOPICAL_CREAM | Freq: Two times a day (BID) | CUTANEOUS | Status: DC
  Administered 2019-06-17: 23:00:00 via TOPICAL
  Filled 2019-06-17: qty 60

## 2019-06-17 NOTE — ED Triage Notes (Signed)
Pt BIBA from home. Pt c/o N/V/D r/t marijuana use. Pt had 2x emesis today, 4x today.  Last MJ use 2 days ago.  VSS

## 2019-06-17 NOTE — ED Provider Notes (Signed)
[facility]-EMERGENCY DEPT Provider Note   CSN: [id number] Arrival date & time: 06/17/19  1746     History   Chief Complaint Chief Complaint  Patient presents with  . Emesis    HPI Randy Stone is a 18 y.o. male with a hx of no major medical problems presents to the Emergency Department complaining of gradual, persistent, progressively worsening nausea and vomiting onset 2 days ago.  Pt reports 20 episodes of nonbilious emesis, consisting of food. Pt reports several episodes of emesis when he saw a speck or two of blood, but no large volume hematemesis or clots.  Pt reports similar symptoms approx 1 month ago.  Pt reports his symptoms were attributed to marijuana usage and he has continued to smoke.  Pt denies abd pain, fever, chills, chest pain, SOB, weakness, dizziness, syncope.  Pt reports some episodes of loose stool but no melena or hematochezia.  Pt denies known sick contacts, new meds, camping, or unsafe food/water.  Pt reports taking "some medicine" this morning given to him at the last visit without relief.  He does not know what he took.  Pt denies COVID contacts. Pt given zofran by EMS which has improved his symptoms.        The history is provided by the patient and medical records. No language interpreter was used.    History reviewed. No pertinent past medical history.  There are no active problems to display for this patient.   History reviewed. No pertinent surgical history.      Home Medications    Prior to Admission medications   Medication Sig Start Date End Date Taking? Authorizing Provider  ondansetron (ZOFRAN ODT) 4 MG disintegrating tablet Take 1 tablet (4 mg total) by mouth every 8 (eight) hours as needed for nausea or vomiting. 06/18/19   Magalie Almon, Dahlia ClientHannah, PA-C    Family History No family history on file.  Social History Social History   Tobacco Use  . Smoking status: Never Smoker  . Smokeless tobacco: Never Used   Substance Use Topics  . Alcohol use: No  . Drug use: Yes    Types: Marijuana     Allergies   Patient has no known allergies.   Review of Systems Review of Systems  Constitutional: Negative for appetite change, diaphoresis, fatigue, fever and unexpected weight change.  HENT: Negative for mouth sores.   Eyes: Negative for visual disturbance.  Respiratory: Negative for cough, chest tightness, shortness of breath and wheezing.   Cardiovascular: Negative for chest pain.  Gastrointestinal: Positive for diarrhea ( loose stools), nausea and vomiting. Negative for abdominal pain and constipation.  Endocrine: Negative for polydipsia, polyphagia and polyuria.  Genitourinary: Negative for dysuria, frequency, hematuria and urgency.  Musculoskeletal: Negative for back pain and neck stiffness.  Skin: Negative for rash.  Allergic/Immunologic: Negative for immunocompromised state.  Neurological: Negative for syncope, light-headedness and headaches.  Hematological: Does not bruise/bleed easily.  Psychiatric/Behavioral: Negative for sleep disturbance. The patient is not nervous/anxious.      Physical Exam Updated Vital Signs BP 108/78 (BP Location: Right Arm)   Pulse 61   Temp 99.2 F (37.3 C) (Oral)   Resp 17   Ht 6\' 2"  (1.88 m)   Wt 59 kg   SpO2 100%   BMI 16.69 kg/m   Physical Exam Vitals signs and nursing note reviewed.  Constitutional:      General: He is not in acute distress.    Appearance: He is not diaphoretic.  HENT:  Head: Normocephalic.     Mouth/Throat:     Mouth: Mucous membranes are dry.     Comments: Slightly dry mucous membranes Eyes:     General: No scleral icterus.    Conjunctiva/sclera: Conjunctivae normal.  Neck:     Musculoskeletal: Normal range of motion.  Cardiovascular:     Rate and Rhythm: Normal rate and regular rhythm.     Pulses: Normal pulses.          Radial pulses are 2+ on the right side and 2+ on the left side.  Pulmonary:     Effort:  No tachypnea, accessory muscle usage, prolonged expiration, respiratory distress or retractions.     Breath sounds: No stridor.     Comments: Equal chest rise. No increased work of breathing. Abdominal:     General: There is no distension.     Palpations: Abdomen is soft.     Tenderness: There is no abdominal tenderness. There is no guarding or rebound.  Musculoskeletal:     Comments: Moves all extremities equally and without difficulty.  Skin:    General: Skin is warm and dry.     Capillary Refill: Capillary refill takes less than 2 seconds.  Neurological:     Mental Status: He is alert.     GCS: GCS eye subscore is 4. GCS verbal subscore is 5. GCS motor subscore is 6.     Comments: Speech is clear and goal oriented.  Psychiatric:        Mood and Affect: Mood normal.      ED Treatments / Results  Labs (all labs ordered are listed, but only abnormal results are displayed) Labs Reviewed  CBC WITH DIFFERENTIAL/PLATELET - Abnormal; Notable for the following components:      Result Value   WBC 12.7 (*)    RBC 5.88 (*)    MCV 77.0 (*)    Neutro Abs 9.4 (*)    All other components within normal limits  URINALYSIS, ROUTINE W REFLEX MICROSCOPIC - Abnormal; Notable for the following components:   Ketones, ur 80 (*)    All other components within normal limits  RAPID URINE DRUG SCREEN, HOSP PERFORMED - Abnormal; Notable for the following components:   Tetrahydrocannabinol POSITIVE (*)    All other components within normal limits  COMPREHENSIVE METABOLIC PANEL  LIPASE, BLOOD     Radiology Dg Abd Acute 2+v W 1v Chest  Result Date: 06/17/2019 CLINICAL DATA:  Vomiting EXAM: DG ABDOMEN ACUTE W/ 1V CHEST COMPARISON:  08/23/2016 FINDINGS: There is no evidence of dilated bowel loops or free intraperitoneal air. No radiopaque calculi or other significant radiographic abnormality is seen. Heart size and mediastinal contours are within normal limits. Both lungs are clear. IMPRESSION:  Negative abdominal radiographs.  No acute cardiopulmonary disease. Electronically Signed   By: Jasmine Pang M.D.   On: 06/17/2019 23:02    Procedures Procedures (including critical care time)  Medications Ordered in ED Medications  sodium chloride 0.9 % bolus 1,000 mL (1,000 mLs Intravenous New Bag/Given 06/17/19 2355)    Followed by  sodium chloride 0.9 % bolus 1,000 mL (0 mLs Intravenous Stopped 06/17/19 2355)    Followed by  0.9 %  sodium chloride infusion (has no administration in time range)  capsaicin (ZOSTRIX) 0.025 % cream ( Topical Given 06/17/19 2306)     Initial Impression / Assessment and Plan / ED Course  I have reviewed the triage vital signs and the nursing notes.  Pertinent labs & imaging results  that were available during my care of the patient were reviewed by me and considered in my medical decision making (see chart for details).  Clinical Course as of Jun 17 112  Sat Jun 18, 2019  0109 Dehydration - pt has received fluids  Ketones, ur(!): 80 [HM]  0110 Leukocytosis likely reactive  WBC(!): 12.7 [HM]  0110 Afebrile, without tachycardia, hypotension ot hypoxia.   Temp: 98.4 F (36.9 C) [HM]  0110 Pt tolerating PO without difficulty.    [HM]  0111 Noted  Tetrahydrocannabinol(!): POSITIVE [HM]    Clinical Course User Index [HM] Marsha Gundlach, Jarrett Soho, PA-C        Patient presents with persistent nausea and vomiting over the last 2 days.  Emesis is not bilious.  Lipase is not elevated, LFTs are not elevated.  Highly doubt cholecystitis.  He denies alcohol usage.  Labs are reassuring.  Mild leukocytosis is likely reactive.  Patient does have ketones in his urine but no protein.  Initial exam showed likely dehydration and patient has been given fluids.  He is tolerating p.o. here in the emergency department without any difficulty.  Highly doubt appendicitis, colitis, diverticulitis.  Patient has never had any previous surgeries.  Given report of intermittent  streaked blood abdominal and chest pain films were obtained.  On repeat exam his abdomen is soft and nontender.  No rebound or guarding.  No evidence of Boerhaave's or small bowel obstruction.  Suspect Mallory-Weiss tear.  No emesis here in the department.  No known COVID contacts and no other COVID-like symptoms.  Discussed reasons to return immediately to the emergency department, marijuana cessation and importance of close primary care follow-up.  Patient states understanding and is in agreement with the plan.    Final Clinical Impressions(s) / ED Diagnoses   Final diagnoses:  Non-intractable vomiting with nausea, unspecified vomiting type  Marijuana abuse, continuous    ED Discharge Orders         Ordered    ondansetron (ZOFRAN ODT) 4 MG disintegrating tablet  Every 8 hours PRN     06/18/19 0108           Ovida Delagarza, Jarrett Soho, PA-C 06/18/19 0114    Ezequiel Essex, MD 06/18/19 [phone]

## 2019-06-18 LAB — COMPREHENSIVE METABOLIC PANEL
ALT: 19 U/L (ref 0–44)
AST: 19 U/L (ref 15–41)
Albumin: 4.4 g/dL (ref 3.5–5.0)
Alkaline Phosphatase: 54 U/L (ref 38–126)
Anion gap: 10 (ref 5–15)
BUN: 16 mg/dL (ref 6–20)
CO2: 24 mmol/L (ref 22–32)
Calcium: 9.1 mg/dL (ref 8.9–10.3)
Chloride: 106 mmol/L (ref 98–111)
Creatinine, Ser: 1 mg/dL (ref 0.61–1.24)
GFR calc Af Amer: >60 mL/min (ref >60)
GFR calc non Af Amer: >60 mL/min (ref >60)
Glucose, Bld: 83 mg/dL (ref 70–99)
Potassium: 3.7 mmol/L (ref 3.5–5.1)
Sodium: 140 mmol/L (ref 135–145)
Total Bilirubin: 1.2 mg/dL (ref 0.3–1.2)
Total Protein: 7.5 g/dL (ref 6.5–8.1)

## 2019-06-18 LAB — RAPID URINE DRUG SCREEN, HOSP PERFORMED
Amphetamines: NOT DETECTED
Barbiturates: NOT DETECTED
Benzodiazepines: NOT DETECTED
Cocaine: NOT DETECTED
Opiates: NOT DETECTED
Tetrahydrocannabinol: POSITIVE — AB

## 2019-06-18 LAB — URINALYSIS, ROUTINE W REFLEX MICROSCOPIC
Bilirubin Urine: NEGATIVE
Glucose, UA: NEGATIVE mg/dL
Hgb urine dipstick: NEGATIVE
Ketones, ur: 80 mg/dL — AB
Leukocytes,Ua: NEGATIVE
Nitrite: NEGATIVE
Protein, ur: NEGATIVE mg/dL
Specific Gravity, Urine: 1.023 (ref 1.005–1.030)
pH: 6 (ref 5.0–8.0)

## 2019-06-18 LAB — LIPASE, BLOOD: Lipase: 25 U/L (ref 11–51)

## 2019-06-18 MED ORDER — ONDANSETRON 4 MG PO TBDP: 4.0000 mg | ORAL_TABLET | Freq: Three times a day (TID) | ORAL | 0 refills | Status: DC | PRN

## 2019-06-18 NOTE — Discharge Instructions (Addendum)
1. Medications: zofran, usual home medications °2. Treatment: rest, drink plenty of fluids, advance diet slowly °3. Follow Up: Please followup with your primary doctor in 2 days for discussion of your diagnoses and further evaluation after today's visit; if you do not have a primary care doctor use the resource guide provided to find one; Please return to the ER for persistent vomiting, high fevers or worsening symptoms ° °

## 2019-06-18 NOTE — ED Notes (Signed)
Patient tolerated PO fluids, denies N/V.

## 2019-07-17 ENCOUNTER — Encounter (HOSPITAL_COMMUNITY): Payer: Self-pay | Admitting: Emergency Medicine

## 2019-07-17 ENCOUNTER — Other Ambulatory Visit: Payer: Self-pay

## 2019-07-17 ENCOUNTER — Emergency Department (HOSPITAL_COMMUNITY)
Admission: EM | Admit: 2019-07-17 | Discharge: 2019-07-17 | Disposition: A | Discharge status: Home / Self Care | Payer: Medicaid Other | Attending: Emergency Medicine | Admitting: Emergency Medicine

## 2019-07-17 DIAGNOSIS — J00 Acute nasopharyngitis [common cold]: Secondary | ICD-10-CM | POA: Insufficient documentation

## 2019-07-17 DIAGNOSIS — Z20828 Contact with and (suspected) exposure to other viral communicable diseases: Secondary | ICD-10-CM | POA: Insufficient documentation

## 2019-07-17 DIAGNOSIS — R05 Cough: Secondary | ICD-10-CM | POA: Diagnosis present

## 2019-07-17 NOTE — ED Triage Notes (Signed)
Pt arrived via [facility] from home with complaints of nausea, SOB, fever, coughing since this morning. No V/D and pt states he has not been in contact with anyone COVID positive.   BP 130/80 HR 82 RR 18 SpO2 97% Temp 101

## 2019-07-17 NOTE — ED Provider Notes (Signed)
[facility]-EMERGENCY DEPT Provider Note  CSN: [id number] Arrival date & time: 07/17/19 0026  Chief Complaint(s) Cough, Fever, and Shortness of Breath  HPI Randy Stone is a 18 y.o. male presents with 1 day of malaise, rhinorrhea, postnasal drip, sore throat, cough and mild shortness of breath.  Denies any fevers or chills.  No known sick contacts.  No exposure to COVID-19 per patient.  Some nausea without emesis.  No diarrhea.  No abdominal pain.  Denies any other physical complaints.  HPI  Past Medical History No past medical history on file. There are no active problems to display for this patient.  Home Medication(s) Prior to Admission medications   Medication Sig Start Date End Date Taking? Authorizing Provider  Capsaicin (ASPERCREME PAIN RELIEF PATCH) 0.025 % PADS Apply 1 patch topically daily as needed (pain).   Yes [provider]  ondansetron (ZOFRAN ODT) 4 MG disintegrating tablet Take 1 tablet (4 mg total) by mouth every 8 (eight) hours as needed for nausea or vomiting. Patient not taking: Reported on 07/17/2019 06/18/19   Muthersbaugh, Boyd KerbsHannah, PA-C                                                                                                                                    Past Surgical History No past surgical history on file. Family History No family history on file.  Social History Social History   Tobacco Use  . Smoking status: Never Smoker  . Smokeless tobacco: Never Used  Substance Use Topics  . Alcohol use: No  . Drug use: Yes    Types: Marijuana   Allergies Patient has no known allergies.  Review of Systems Review of Systems All other systems are reviewed and are negative for acute change except as noted in the HPI  Physical Exam Vital Signs  I have reviewed the triage vital signs BP 135/71 (BP Location: Left Arm) Comment: Simultaneous filing. User may not have seen previous data.  Pulse 62 Comment:  Simultaneous filing. User may not have seen previous data.  Temp 98.3 F (36.8 C) (Oral)   Resp 16   Ht 6\' 1"  (1.854 m)   Wt 59 kg   SpO2 99% Comment: Simultaneous filing. User may not have seen previous data.  BMI 17.15 kg/m   Physical Exam Vitals signs reviewed.  Constitutional:      General: He is not in acute distress.    Appearance: He is well-developed. He is not diaphoretic.  HENT:     Head: Normocephalic and atraumatic.     Comments: Post nasal drip    Nose: Nose normal.  Eyes:     General: No scleral icterus.       Right eye: No discharge.        Left eye: No discharge.     Conjunctiva/sclera: Conjunctivae normal.     Pupils: Pupils are equal, round, and reactive to light.  Neck:     Musculoskeletal: Normal range of motion and neck supple.  Cardiovascular:     Rate and Rhythm: Normal rate and regular rhythm.     Heart sounds: No murmur. No friction rub. No gallop.   Pulmonary:     Effort: Pulmonary effort is normal. No respiratory distress.     Breath sounds: Normal breath sounds. No stridor. No rales.  Abdominal:     General: There is no distension.     Palpations: Abdomen is soft.     Tenderness: There is no abdominal tenderness.  Musculoskeletal:        General: No tenderness.  Skin:    General: Skin is warm and dry.     Findings: No erythema or rash.  Neurological:     Mental Status: He is alert and oriented to person, place, and time.     ED Results and Treatments Labs (all labs ordered are listed, but only abnormal results are displayed) Labs Reviewed  NOVEL CORONAVIRUS, NAA (HOSP ORDER, SEND-OUT TO REF LAB; TAT 18-24 HRS)                                                                                                                         EKG  EKG Interpretation  Date/Time:    Ventricular Rate:    PR Interval:    QRS Duration:   QT Interval:    QTC Calculation:   R Axis:     Text Interpretation:        Radiology No results found.   Pertinent labs & imaging results that were available during my care of the patient were reviewed by me and considered in my medical decision making (see chart for details).  Medications Ordered in ED Medications - No data to display                                                                                                                                  Procedures Procedures  (including critical care time)  Medical Decision Making / ED Course I have reviewed the nursing notes for this encounter and the patient's prior records (if available in EHR or on provided paperwork).   Randy Stone was evaluated in Emergency Department on 07/17/2019 for the symptoms described in the history of present illness. He was evaluated in the context of the global COVID-19 pandemic, which necessitated consideration that the patient might  be at risk for infection with the SARS-CoV-2 virus that causes COVID-19. Institutional protocols and algorithms that pertain to the evaluation of patients at risk for COVID-19 are in a state of rapid change based on information released by regulatory bodies including the CDC and federal and state organizations. These policies and algorithms were followed during the patient's care in the ED.  Patient presents with viral symptoms for 1 day. adequate oral hydration. Rest of history as above.  Patient appears well. No signs of toxicity, patient is interactive. No hypoxia, tachypnea or other signs of respiratory distress. No sign of clinical dehydration. Lung exam clear. Rest of exam as above.  Most consistent with viral illness   No evidence suggestive of pharyngitis, AOM, PNA, or meningitis.  Chest x-ray not indicated at this time.  Patient evaluated, tested and sent home with instructions for home care and Quarantine. Instructed to seek further care if symptoms worsen.  Is set up with follow up for a virtual visit with PCP in next 24-48 hours.   Discussed  symptomatic treatment with the patient and they will follow closely with their PCP.        Final Clinical Impression(s) / ED Diagnoses Final diagnoses:  Acute nasopharyngitis    The patient appears reasonably screened and/or stabilized for discharge and I doubt any other medical condition or other [facility] requiring further screening, evaluation, or treatment in the ED at this time prior to discharge.  Disposition: Discharge  Condition: Good  I have discussed the results, Dx and Tx plan with the patient who expressed understanding and agree(s) with the plan. Discharge instructions discussed at great length. The patient was given strict return precautions who verbalized understanding of the instructions. No further questions at time of discharge.    ED Discharge Orders    None      Follow Up: Primary care provider  Schedule an appointment as soon as possible for a visit  If you do not have a primary care physician, contact [facility] at [phone] for referral      This chart was dictated using voice recognition software.  Despite best efforts to proofread,  errors can occur which can change the documentation meaning.   Fatima Blank, MD 07/17/19 [phone]

## 2019-07-18 LAB — NOVEL CORONAVIRUS, NAA (HOSP ORDER, SEND-OUT TO REF LAB; TAT 18-24 HRS): SARS-CoV-2, NAA: NOT DETECTED

## 2019-12-29 ENCOUNTER — Encounter (HOSPITAL_COMMUNITY): Payer: Self-pay

## 2019-12-29 ENCOUNTER — Other Ambulatory Visit: Payer: Self-pay

## 2019-12-29 ENCOUNTER — Emergency Department (HOSPITAL_COMMUNITY)
Admission: EM | Admit: 2019-12-29 | Discharge: 2019-12-29 | Disposition: A | Discharge status: Home / Self Care | Payer: Medicaid Other | Attending: Emergency Medicine | Admitting: Emergency Medicine

## 2019-12-29 DIAGNOSIS — F1290 Cannabis use, unspecified, uncomplicated: Secondary | ICD-10-CM | POA: Insufficient documentation

## 2019-12-29 DIAGNOSIS — R10817 Generalized abdominal tenderness: Secondary | ICD-10-CM | POA: Diagnosis not present

## 2019-12-29 DIAGNOSIS — R1115 Cyclical vomiting syndrome unrelated to migraine: Secondary | ICD-10-CM | POA: Diagnosis not present

## 2019-12-29 DIAGNOSIS — R1110 Vomiting, unspecified: Secondary | ICD-10-CM | POA: Diagnosis present

## 2019-12-29 LAB — LIPASE, BLOOD: Lipase: 22 U/L (ref 11–51)

## 2019-12-29 LAB — CBC WITH DIFFERENTIAL/PLATELET
Abs Immature Granulocytes: 0.02 10*3/uL (ref 0.00–0.07)
Basophils Absolute: 0.1 10*3/uL (ref 0.0–0.1)
Basophils Relative: 1 %
Eosinophils Absolute: 0 10*3/uL (ref 0.0–0.5)
Eosinophils Relative: 0 %
HCT: 48.1 % (ref 39.0–52.0)
Hemoglobin: 16 g/dL (ref 13.0–17.0)
Immature Granulocytes: 0 %
Lymphocytes Relative: 19 %
Lymphs Abs: 1.4 10*3/uL (ref 0.7–4.0)
MCH: 26.5 pg (ref 26.0–34.0)
MCHC: 33.3 g/dL (ref 30.0–36.0)
MCV: 79.6 fL — ABNORMAL LOW (ref 80.0–100.0)
Monocytes Absolute: 0.5 10*3/uL (ref 0.1–1.0)
Monocytes Relative: 6 %
Neutro Abs: 5.7 10*3/uL (ref 1.7–7.7)
Neutrophils Relative %: 74 %
Platelets: 179 10*3/uL (ref 150–400)
RBC: 6.04 MIL/uL — ABNORMAL HIGH (ref 4.22–5.81)
RDW: 13.7 % (ref 11.5–15.5)
WBC: 7.7 10*3/uL (ref 4.0–10.5)
nRBC: 0 % (ref 0.0–0.2)

## 2019-12-29 LAB — COMPREHENSIVE METABOLIC PANEL
ALT: 23 U/L (ref 0–44)
AST: 24 U/L (ref 15–41)
Albumin: 4.6 g/dL (ref 3.5–5.0)
Alkaline Phosphatase: 61 U/L (ref 38–126)
Anion gap: 11 (ref 5–15)
BUN: 15 mg/dL (ref 6–20)
CO2: 27 mmol/L (ref 22–32)
Calcium: 9.9 mg/dL (ref 8.9–10.3)
Chloride: 102 mmol/L (ref 98–111)
Creatinine, Ser: 1.2 mg/dL (ref 0.61–1.24)
GFR calc Af Amer: >60 mL/min (ref >60)
GFR calc non Af Amer: >60 mL/min (ref >60)
Glucose, Bld: 120 mg/dL — ABNORMAL HIGH (ref 70–99)
Potassium: 4.4 mmol/L (ref 3.5–5.1)
Sodium: 140 mmol/L (ref 135–145)
Total Bilirubin: 1.2 mg/dL (ref 0.3–1.2)
Total Protein: 7.5 g/dL (ref 6.5–8.1)

## 2019-12-29 MED ORDER — ONDANSETRON 4 MG PO TBDP
8.0000 mg | ORAL_TABLET | Freq: Once | ORAL | Status: AC
  Administered 2019-12-29: 06:00:00 8 mg via ORAL
  Filled 2019-12-29: qty 2

## 2019-12-29 MED ORDER — HALOPERIDOL LACTATE 5 MG/ML IJ SOLN
2.0000 mg | Freq: Once | INTRAMUSCULAR | Status: AC
  Administered 2019-12-29: 08:00:00 2 mg via INTRAVENOUS
  Filled 2019-12-29: qty 1

## 2019-12-29 MED ORDER — SODIUM CHLORIDE 0.9 % IV BOLUS
1000.0000 mL | Freq: Once | INTRAVENOUS | Status: AC
  Administered 2019-12-29: 08:00:00 1000 mL via INTRAVENOUS

## 2019-12-29 NOTE — ED Triage Notes (Signed)
Pt by POV, has been throwing up since 1am. Reports having smoked marijuana last night & eating pizza. PA at bedside on triage, generalized pain in abdomen.

## 2019-12-29 NOTE — ED Notes (Signed)
This RN found pt vomiting, no apple juice or graham crackers had been ingested. Will inform PA

## 2019-12-29 NOTE — ED Provider Notes (Signed)
[facility] Provider Note   CSN: [id number] Arrival date & time: 12/29/19  0524     History Chief Complaint  Patient presents with  . Emesis    [facility] is a 19 y.o. male.  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  Patient reports that he smokes marijuana and had pizza last night, began vomiting around 1 AM and has had 5 episodes of none bilious nonbloody vomiting consistent with stomach contents.  He has not been able to eat or drink anything since then.  He reports mild abdominal pain rating it a 2 out of 10.  Reports last bowel movement was an hour ago, stool was formed and hard. No known sick/COVID contacts, camping, use of unsafe food/water, no new meds. He denies fever, chills, diarrhea, dysuria, hematuria, diarrhea, syncope, testicular pain      History reviewed. No pertinent past medical history.  There are no problems to display for this patient.   History reviewed. No pertinent surgical history.     History reviewed. No pertinent family history.  Social History   Tobacco Use  . Smoking status: Never Smoker  . Smokeless tobacco: Never Used  Substance Use Topics  . Alcohol use: No  . Drug use: Yes    Types: Marijuana    Home Medications Prior to Admission medications   Medication Sig Start Date End Date Taking? Authorizing Provider  Capsaicin (ASPERCREME PAIN RELIEF PATCH) 0.025 % PADS Apply 1 patch topically daily as needed (pain).    [provider]  ondansetron (ZOFRAN ODT) 4 MG disintegrating tablet Take 1 tablet (4 mg total) by mouth every 8 (eight) hours as needed for nausea or vomiting. Patient not taking: Reported on 07/17/2019 06/18/19   Muthersbaugh, Jarrett Soho, PA-C    Allergies    Patient has no known allergies.  Review of Systems   Review of Systems    Constitutional: Negative for chills and fever.  HENT: Negative for ear pain and sore throat.   Eyes: Negative for pain and visual disturbance.  Respiratory: Negative for cough and shortness of breath.   Cardiovascular: Negative for chest pain and palpitations.  Gastrointestinal: Positive for abdominal pain, constipation, nausea and vomiting. Negative for abdominal distention, blood in stool and diarrhea.  Genitourinary: Negative for dysuria, flank pain, hematuria, penile pain, penile swelling, scrotal swelling and testicular pain.  Musculoskeletal: Negative for arthralgias and back pain.  Skin: Negative for color change and rash.  Neurological: Positive for dizziness and weakness. Negative for seizures and syncope.  Psychiatric/Behavioral: Negative for confusion.  All other systems reviewed and are negative.   Physical Exam Updated Vital Signs BP 122/70 (BP Location: Left Arm)   Pulse (!) 58   Temp (!) 97.4 F (36.3 C) (Oral)   Resp 16   Ht 6\' 1"  (1.854 m)   Wt 59 kg   SpO2 100%   BMI 17.15 kg/m   Physical Exam Constitutional:      Appearance: Normal appearance.  HENT:     Head: Normocephalic and atraumatic.     Mouth/Throat:     Mouth: Mucous membranes are dry.     Pharynx: No oropharyngeal exudate or posterior oropharyngeal erythema.  Eyes:     Extraocular Movements: Extraocular movements intact.     Conjunctiva/sclera: Conjunctivae normal.     Pupils: Pupils are equal, round, and reactive  to light.  Cardiovascular:     Rate and Rhythm: Normal rate and regular rhythm.     Pulses: Normal pulses.     Heart sounds: Normal heart sounds.  Pulmonary:     Effort: Pulmonary effort is normal.     Breath sounds: Normal breath sounds.  Abdominal:     General: Abdomen is flat. Bowel sounds are normal. There is no distension.     Palpations: Abdomen is soft. There is no mass.     Tenderness: There is generalized abdominal tenderness. There is no right CVA tenderness, left CVA  tenderness, guarding or rebound. Negative signs include Murphy's sign, Rovsing's sign, McBurney's sign and psoas sign.  Genitourinary:    Penis: Normal.      Testes: Normal.     Prostate: Normal.  Musculoskeletal:        General: Normal range of motion.     Cervical back: Normal range of motion. No tenderness.  Skin:    General: Skin is warm and dry.  Neurological:     General: No focal deficit present.     Mental Status: He is alert and oriented to person, place, and time.  Psychiatric:        Mood and Affect: Mood normal.        Behavior: Behavior normal.     ED Results / Procedures / Treatments   Labs (all labs ordered are listed, but only abnormal results are displayed) Labs Reviewed - No data to display  EKG None  Radiology No results found.  Procedures Procedures (including critical care time)  Medications Ordered in ED Medications  ondansetron (ZOFRAN-ODT) disintegrating tablet 8 mg (8 mg Oral Given 12/29/19 6967)    ED Course  I have reviewed the triage vital signs and the nursing notes.  Pertinent labs & imaging results that were available during my care of the patient were reviewed by me and considered in my medical decision making (see chart for details).    MDM Rules/Calculators/A&P                     19 year old male with episodes of vomiting since 1 AM this morning after smoking cannabis last night. On presentation to the ER, patient appears in mild distress, slightly lethargic and tearful in his responses. Appears to be still under the influence of cannabis.  Vitals non-concerning; normotensive, afebrile, not tachycardic or tachypneic.  Abdominal physical exam showed generalized abdominal tenderness but no focal tenderness.  Given patient admits to smoking marijuana last night, w/ frequent visits to the ED for cannabinoid hyperemesis syndrome paired with nonconcerning vitals and nonspecific physical exam, I do not think additional lab work is indicated at  this time. Will order ODT Zofran, attempt p.o. fluid challenge, anticipate discharge with Zofran.     On PO challenge pt continues to vomit nonbloody nonbilous clear liquids. Signed out patient to [employer].Suspect further need for  ED management w/ IV fluids, possible Zostrix, Haldol.    Final Clinical Impression(s) / ED Diagnoses Final diagnoses:  None    Rx / DC Orders ED Discharge Orders    None       Leone Brand 12/29/19 8938    Marily Memos, MD 12/29/19 2258

## 2019-12-29 NOTE — Discharge Instructions (Signed)
Continue taking your nausea medications as needed. You can slowly increase your diet as tolerated over the next few days. Make sure you are drinking plenty of fluids. Return to the ED if you start to experience worsening symptoms, fever, chest pain, shortness of breath, vomiting or coughing up blood.

## 2019-12-29 NOTE — ED Notes (Signed)
Graham crackers & apple juice provided for PO challenge.

## 2019-12-29 NOTE — ED Provider Notes (Signed)
Physical Exam  BP 136/85   Pulse (!) 58   Temp (!) 97.4 F (36.3 C) (Oral)   Resp 16   Ht 6\' 1"  (1.854 m)   Wt 59 kg   SpO2 100%   BMI 17.15 kg/m   Physical Exam Vitals and nursing note reviewed.  Constitutional:      General: He is not in acute distress.    Appearance: He is well-developed. He is not diaphoretic.  HENT:     Head: Normocephalic and atraumatic.  Eyes:     General: No scleral icterus.    Conjunctiva/sclera: Conjunctivae normal.  Pulmonary:     Effort: Pulmonary effort is normal. No respiratory distress.  Musculoskeletal:     Cervical back: Normal range of motion.  Skin:    Findings: No rash.  Neurological:     Mental Status: He is alert.     ED Course/Procedures     Procedures  MDM  Care of patient assumed from PA Belaya at 7:00 AM.  Agree with history, physical exam and plan.  See their note for further details.  Briefly, 19 y.o. male with PMH/PSH as below who presents with nausea and vomiting since this morning. Reports several episodes of NBNB emesis since earlier this morning. Does admit to smoking marijuana prior to symptom onset and has been seen for similar symptoms due to marijuana use in the past.  Denies any changes to bowel movements or significant abdominal pain.  Patient was given 8 mg of ODT Zofran.  History reviewed. No pertinent past medical history. History reviewed. No pertinent surgical history.    Current Plan: Reassess after Zofran. Will need further workup and treatment if remains symptomatic. Likely discharge home.   MDM/ED Course: Patient continues to have vomiting despite Zofran.  Lab work including CBC, CMP and lipase are unremarkable.  Patient given IV fluids, Haldol which based on chart review seems like has worked for patient in the past. Will try PO again.  8:54 AM On recheck, patient able to tolerate water without difficulty.  He is resting comfortably.  He is requesting discharge.  I feel this is reasonable as his  symptoms are controlled today.  States that he has antiemetics at home that he will take.  Advised to still advance his diet as tolerated and to return for worsening symptoms. At this time I feel that his symptoms are most likely due to cervical vomiting secondary to marijuana use. Doubt appendicitis, pancreatitis, cholecystitis or other emergent or surgical cause based on his overall well appearance and improvement. Patient educated on cessation of marijuana use.   Consults: None    Significant labs/images: Labs Reviewed  COMPREHENSIVE METABOLIC PANEL - Abnormal; Notable for the following components:      Result Value   Glucose, Bld 120 (*)    All other components within normal limits  CBC WITH DIFFERENTIAL/PLATELET - Abnormal; Notable for the following components:   RBC 6.04 (*)    MCV 79.6 (*)    All other components within normal limits  LIPASE, BLOOD      Patient is hemodynamically stable, in NAD. Evaluation does not show pathology that would require ongoing emergent intervention or inpatient treatment. I have personally reviewed and interpreted all lab work and imaging at today's ED visit. I explained the diagnosis to the patient. Pain has been managed and has no complaints prior to discharge. Patient is comfortable with above plan and is stable for discharge at this time. All questions were answered prior to  disposition. Strict return precautions for returning to the ED were discussed. Encouraged follow up with PCP.   An After Visit Summary was printed and given to the patient.   Portions of this note were generated with [employer]. Dictation errors may occur despite best attempts at proofreading.        Delia Heady, PA-C 12/29/19 8110    Maudie Flakes, MD 01/02/20 [phone]

## 2019-12-30 ENCOUNTER — Other Ambulatory Visit: Payer: Self-pay

## 2019-12-30 ENCOUNTER — Emergency Department (HOSPITAL_COMMUNITY)
Admission: EM | Admit: 2019-12-30 | Discharge: 2019-12-30 | Disposition: A | Discharge status: Home / Self Care | Payer: Medicaid Other | Attending: Emergency Medicine | Admitting: Emergency Medicine

## 2019-12-30 ENCOUNTER — Encounter (HOSPITAL_COMMUNITY): Payer: Self-pay | Admitting: Emergency Medicine

## 2019-12-30 DIAGNOSIS — Z79899 Other long term (current) drug therapy: Secondary | ICD-10-CM | POA: Insufficient documentation

## 2019-12-30 DIAGNOSIS — R1084 Generalized abdominal pain: Secondary | ICD-10-CM | POA: Diagnosis not present

## 2019-12-30 DIAGNOSIS — R1011 Right upper quadrant pain: Secondary | ICD-10-CM | POA: Diagnosis present

## 2019-12-30 LAB — CBC WITH DIFFERENTIAL/PLATELET
Abs Immature Granulocytes: 0.02 10*3/uL (ref 0.00–0.07)
Basophils Absolute: 0 10*3/uL (ref 0.0–0.1)
Basophils Relative: 1 %
Eosinophils Absolute: 0 10*3/uL (ref 0.0–0.5)
Eosinophils Relative: 0 %
HCT: 48 % (ref 39.0–52.0)
Hemoglobin: 16.3 g/dL (ref 13.0–17.0)
Immature Granulocytes: 0 %
Lymphocytes Relative: 22 %
Lymphs Abs: 1.5 10*3/uL (ref 0.7–4.0)
MCH: 26.9 pg (ref 26.0–34.0)
MCHC: 34 g/dL (ref 30.0–36.0)
MCV: 79.1 fL — ABNORMAL LOW (ref 80.0–100.0)
Monocytes Absolute: 0.4 10*3/uL (ref 0.1–1.0)
Monocytes Relative: 6 %
Neutro Abs: 4.9 10*3/uL (ref 1.7–7.7)
Neutrophils Relative %: 71 %
Platelets: 176 10*3/uL (ref 150–400)
RBC: 6.07 MIL/uL — ABNORMAL HIGH (ref 4.22–5.81)
RDW: 13.4 % (ref 11.5–15.5)
WBC: 6.9 10*3/uL (ref 4.0–10.5)
nRBC: 0 % (ref 0.0–0.2)

## 2019-12-30 LAB — COMPREHENSIVE METABOLIC PANEL
ALT: 21 U/L (ref 0–44)
AST: 20 U/L (ref 15–41)
Albumin: 4.6 g/dL (ref 3.5–5.0)
Alkaline Phosphatase: 64 U/L (ref 38–126)
Anion gap: 12 (ref 5–15)
BUN: 13 mg/dL (ref 6–20)
CO2: 26 mmol/L (ref 22–32)
Calcium: 10.1 mg/dL (ref 8.9–10.3)
Chloride: 102 mmol/L (ref 98–111)
Creatinine, Ser: 1.29 mg/dL — ABNORMAL HIGH (ref 0.61–1.24)
GFR calc Af Amer: >60 mL/min (ref >60)
GFR calc non Af Amer: >60 mL/min (ref >60)
Glucose, Bld: 104 mg/dL — ABNORMAL HIGH (ref 70–99)
Potassium: 3.9 mmol/L (ref 3.5–5.1)
Sodium: 140 mmol/L (ref 135–145)
Total Bilirubin: 1.4 mg/dL — ABNORMAL HIGH (ref 0.3–1.2)
Total Protein: 7.6 g/dL (ref 6.5–8.1)

## 2019-12-30 LAB — URINALYSIS, ROUTINE W REFLEX MICROSCOPIC
Bilirubin Urine: NEGATIVE
Glucose, UA: NEGATIVE mg/dL
Hgb urine dipstick: NEGATIVE
Ketones, ur: 20 mg/dL — AB
Leukocytes,Ua: NEGATIVE
Nitrite: NEGATIVE
Protein, ur: NEGATIVE mg/dL
Specific Gravity, Urine: 1.013 (ref 1.005–1.030)
pH: 9 — ABNORMAL HIGH (ref 5.0–8.0)

## 2019-12-30 LAB — LIPASE, BLOOD: Lipase: 22 U/L (ref 11–51)

## 2019-12-30 MED ORDER — SODIUM CHLORIDE 0.9 % IV BOLUS
1000.0000 mL | Freq: Once | INTRAVENOUS | Status: AC
  Administered 2019-12-30: 1000 mL via INTRAVENOUS

## 2019-12-30 MED ORDER — SODIUM CHLORIDE 0.9 % IV BOLUS
1000.0000 mL | Freq: Once | INTRAVENOUS | Status: AC
  Administered 2019-12-30: 11:00:00 1000 mL via INTRAVENOUS

## 2019-12-30 MED ORDER — PROMETHAZINE HCL 25 MG/ML IJ SOLN
25.0000 mg | Freq: Once | INTRAMUSCULAR | Status: DC
  Filled 2019-12-30: qty 1

## 2019-12-30 NOTE — ED Notes (Signed)
Offered pt nausea medication, pt declined. States he is no longer feeling nauseated. Provided with apple juice.

## 2019-12-30 NOTE — ED Triage Notes (Signed)
Pt. Presents from home via ems, c/o Abdominal pain, RUQ, associated with feelings of nausea and numbness to hands and feet for the past day. Pt. Relates recent d/c from ED with similar symptoms. Patient relates recent use of  synthetic cannabis at home. Denies fever, chills or diarrhea.

## 2019-12-30 NOTE — Discharge Instructions (Addendum)
Your laboratory results were within normal limits today.  We discussed increasing your fluid intake while at home.  Please discontinue marijuana use as this is likely exacerbating your symptoms.

## 2019-12-30 NOTE — ED Notes (Signed)
Urine culture collected sent to main lab

## 2019-12-30 NOTE — ED Notes (Signed)
Patient Alert and oriented to baseline. Stable and ambulatory to baseline. Patient verbalized understanding of the discharge instructions.  Patient belongings were taken by the patient.   

## 2020-01-17 ENCOUNTER — Encounter (HOSPITAL_COMMUNITY): Payer: Self-pay

## 2020-01-17 ENCOUNTER — Emergency Department (HOSPITAL_COMMUNITY)
Admission: EM | Admit: 2020-01-17 | Discharge: 2020-01-17 | Disposition: A | Discharge status: Home / Self Care | Payer: Medicaid Other | Attending: Emergency Medicine | Admitting: Emergency Medicine

## 2020-01-17 ENCOUNTER — Other Ambulatory Visit: Payer: Self-pay

## 2020-01-17 DIAGNOSIS — R1084 Generalized abdominal pain: Secondary | ICD-10-CM | POA: Diagnosis not present

## 2020-01-17 DIAGNOSIS — R112 Nausea with vomiting, unspecified: Secondary | ICD-10-CM | POA: Diagnosis present

## 2020-01-17 LAB — COMPREHENSIVE METABOLIC PANEL
ALT: 40 U/L (ref 0–44)
AST: 22 U/L (ref 15–41)
Albumin: 5.1 g/dL — ABNORMAL HIGH (ref 3.5–5.0)
Alkaline Phosphatase: 70 U/L (ref 38–126)
Anion gap: 12 (ref 5–15)
BUN: 9 mg/dL (ref 6–20)
CO2: 26 mmol/L (ref 22–32)
Calcium: 10 mg/dL (ref 8.9–10.3)
Chloride: 103 mmol/L (ref 98–111)
Creatinine, Ser: 0.94 mg/dL (ref 0.61–1.24)
GFR calc Af Amer: >60 mL/min (ref >60)
GFR calc non Af Amer: >60 mL/min (ref >60)
Glucose, Bld: 88 mg/dL (ref 70–99)
Potassium: 4.3 mmol/L (ref 3.5–5.1)
Sodium: 141 mmol/L (ref 135–145)
Total Bilirubin: 1.3 mg/dL — ABNORMAL HIGH (ref 0.3–1.2)
Total Protein: 8.6 g/dL — ABNORMAL HIGH (ref 6.5–8.1)

## 2020-01-17 LAB — CBC
HCT: 51 % (ref 39.0–52.0)
Hemoglobin: 16.8 g/dL (ref 13.0–17.0)
MCH: 26.5 pg (ref 26.0–34.0)
MCHC: 32.9 g/dL (ref 30.0–36.0)
MCV: 80.4 fL (ref 80.0–100.0)
Platelets: 224 10*3/uL (ref 150–400)
RBC: 6.34 MIL/uL — ABNORMAL HIGH (ref 4.22–5.81)
RDW: 13.9 % (ref 11.5–15.5)
WBC: 8.7 10*3/uL (ref 4.0–10.5)
nRBC: 0 % (ref 0.0–0.2)

## 2020-01-17 LAB — URINALYSIS, ROUTINE W REFLEX MICROSCOPIC
Bacteria, UA: NONE SEEN
Bilirubin Urine: NEGATIVE
Glucose, UA: NEGATIVE mg/dL
Hgb urine dipstick: NEGATIVE
Ketones, ur: 20 mg/dL — AB
Nitrite: NEGATIVE
Protein, ur: NEGATIVE mg/dL
Specific Gravity, Urine: 1.019 (ref 1.005–1.030)
pH: 8 (ref 5.0–8.0)

## 2020-01-17 LAB — LIPASE, BLOOD: Lipase: 20 U/L (ref 11–51)

## 2020-01-17 MED ORDER — ONDANSETRON HCL 4 MG/2ML IJ SOLN
4.0000 mg | Freq: Once | INTRAMUSCULAR | Status: AC
  Administered 2020-01-17: 21:00:00 4 mg via INTRAVENOUS
  Filled 2020-01-17: qty 2

## 2020-01-17 MED ORDER — SODIUM CHLORIDE 0.9% FLUSH: 3.0000 mL | Freq: Once | INTRAVENOUS | Status: DC

## 2020-01-17 MED ORDER — FAMOTIDINE 20 MG PO TABS: 20.0000 mg | ORAL_TABLET | Freq: Two times a day (BID) | ORAL | 0 refills | Status: DC

## 2020-01-17 MED ORDER — SODIUM CHLORIDE 0.9 % IV BOLUS (SEPSIS)
1000.0000 mL | Freq: Once | INTRAVENOUS | Status: AC
  Administered 2020-01-17: 1000 mL via INTRAVENOUS

## 2020-01-17 MED ORDER — ONDANSETRON 4 MG PO TBDP: ORAL_TABLET | ORAL | 0 refills | Status: DC

## 2020-01-17 NOTE — ED Triage Notes (Signed)
Per EMS- Patient develop abdominal pain, N/V after eating at [employer] out today.

## 2020-01-17 NOTE — ED Provider Notes (Signed)
[facility]-EMERGENCY DEPT Provider Note   CSN: [id number] Arrival date & time: 01/17/20  1559     History Chief Complaint  Patient presents with  . Abdominal Pain  . Emesis    [facility] is a 19 y.o. male.  Patient complains of vomiting and some abdominal discomfort with loose stools.  The history is provided by the patient. No language interpreter was used.  Abdominal Pain Pain location:  Generalized Pain radiates to:  Does not radiate Pain severity:  Mild Onset quality:  Sudden Timing:  Intermittent Progression:  Waxing and waning Chronicity:  New Context: not alcohol use   Associated symptoms: vomiting   Associated symptoms: no chest pain, no cough, no diarrhea, no fatigue and no hematuria   Emesis Associated symptoms: abdominal pain   Associated symptoms: no cough, no diarrhea and no headaches        History reviewed. No pertinent past medical history.  There are no problems to display for this patient.   History reviewed. No pertinent surgical history.     Family History  Problem Relation Age of Onset  . Healthy Mother   . Healthy Father     Social History   Tobacco Use  . Smoking status: Never Smoker  . Smokeless tobacco: Never Used  Substance Use Topics  . Alcohol use: No  . Drug use: Yes    Types: Marijuana    Home Medications Prior to Admission medications   Medication Sig Start Date End Date Taking? Authorizing Provider  ondansetron (ZOFRAN ODT) 4 MG disintegrating tablet 4mg  ODT q4 hours prn nausea/vomit 01/17/20   01/19/20, MD    Allergies    Patient has no known allergies.  Review of Systems   Review of Systems  Constitutional: Negative for appetite change and fatigue.  HENT: Negative for congestion, ear discharge and sinus pressure.   Eyes: Negative for discharge.  Respiratory: Negative for cough.   Cardiovascular: Negative for chest pain.  Gastrointestinal: Positive for abdominal pain and  vomiting. Negative for diarrhea.  Genitourinary: Negative for frequency and hematuria.  Musculoskeletal: Negative for back pain.  Skin: Negative for rash.  Neurological: Negative for seizures and headaches.  Psychiatric/Behavioral: Negative for hallucinations.    Physical Exam Updated Vital Signs BP 122/72   Pulse (!) 54   Temp 98.4 F (36.9 C) (Oral)   Resp 15   Ht 6\' 1"  (1.854 m)   Wt 59 kg   SpO2 98%   BMI 17.15 kg/m   Physical Exam Vitals and nursing note reviewed.  Constitutional:      Appearance: He is well-developed.  HENT:     Head: Normocephalic.     Nose: Nose normal.  Eyes:     General: No scleral icterus.    Conjunctiva/sclera: Conjunctivae normal.  Neck:     Thyroid: No thyromegaly.  Cardiovascular:     Rate and Rhythm: Normal rate and regular rhythm.     Heart sounds: No murmur. No friction rub. No gallop.   Pulmonary:     Breath sounds: No stridor. No wheezing or rales.  Chest:     Chest wall: No tenderness.  Abdominal:     General: There is no distension.     Tenderness: There is abdominal tenderness. There is no rebound.  Musculoskeletal:        General: Normal range of motion.     Cervical back: Neck supple.  Lymphadenopathy:     Cervical: No cervical adenopathy.  Skin:  Findings: No erythema or rash.  Neurological:     Mental Status: He is alert and oriented to person, place, and time.     Motor: No abnormal muscle tone.     Coordination: Coordination normal.  Psychiatric:        Behavior: Behavior normal.     ED Results / Procedures / Treatments   Labs (all labs ordered are listed, but only abnormal results are displayed) Labs Reviewed  COMPREHENSIVE METABOLIC PANEL - Abnormal; Notable for the following components:      Result Value   Total Protein 8.6 (*)    Albumin 5.1 (*)    Total Bilirubin 1.3 (*)    All other components within normal limits  CBC - Abnormal; Notable for the following components:   RBC 6.34 (*)    All  other components within normal limits  URINALYSIS, ROUTINE W REFLEX MICROSCOPIC - Abnormal; Notable for the following components:   Ketones, ur 20 (*)    Leukocytes,Ua TRACE (*)    All other components within normal limits  URINE CULTURE  LIPASE, BLOOD    EKG None  Radiology No results found.  Procedures Procedures (including critical care time)  Medications Ordered in ED Medications  sodium chloride flush (NS) 0.9 % injection 3 mL (3 mLs Intravenous Not Given 01/17/20 2037)  sodium chloride 0.9 % bolus 1,000 mL (1,000 mLs Intravenous New Bag/Given 01/17/20 2037)  ondansetron (ZOFRAN) injection 4 mg (4 mg Intravenous Given 01/17/20 2036)    ED Course  I have reviewed the triage vital signs and the nursing notes.  Pertinent labs & imaging results that were available during my care of the patient were reviewed by me and considered in my medical decision making (see chart for details).    MDM Rules/Calculators/A&P                      Patient with vomiting and loose stools. Possible gastroenteritis. Patient is sent home with Zofran and Pepcid for possible stomach inflammation. He will follow-up with PCP   This patient presents to the ED for concern of vomiting, this involves an extensive number of treatment options, and is a complaint that carries with it a high risk of complications and morbidity.  The differential diagnosis includes gastroenteritis gastritis appendicitis   Lab Tests:   I Ordered, reviewed, and interpreted labs, which included CBC chemistries unremarkable  Medicines ordered:   I ordered medication Zofran and saline for dehydration and nausea  Imaging Studies ordered:   Additional history obtained:   Additional history obtained from chart  Previous records obtained and reviewed   Consultations Obtained:     Reevaluation:  After the interventions stated above, I reevaluated the patient and found improved after IV fluids  Critical  Interventions:  .   Final Clinical Impression(s) / ED Diagnoses Final diagnoses:  Non-intractable vomiting with nausea, unspecified vomiting type    Rx / DC Orders ED Discharge Orders         Ordered    ondansetron (ZOFRAN ODT) 4 MG disintegrating tablet     01/17/20 2211           Milton Ferguson, MD 01/17/20 2219

## 2020-01-17 NOTE — ED Notes (Signed)
Called patient name in the main lobby for blood draw and no one responded

## 2020-01-17 NOTE — ED Notes (Signed)
An After Visit Summary was printed and given to the patient. Discharge instructions given and no further questions at this time.  

## 2020-01-17 NOTE — Discharge Instructions (Addendum)
Follow-up with a family doctor if not improving. Drink plenty of fluids

## 2020-01-19 LAB — URINE CULTURE: Culture: NO GROWTH

## 2020-02-03 ENCOUNTER — Other Ambulatory Visit: Payer: Self-pay

## 2020-02-03 ENCOUNTER — Emergency Department (HOSPITAL_COMMUNITY)
Admission: EM | Admit: 2020-02-03 | Discharge: 2020-02-03 | Disposition: A | Discharge status: Home / Self Care | Payer: Medicaid Other | Attending: Emergency Medicine | Admitting: Emergency Medicine

## 2020-02-03 ENCOUNTER — Encounter (HOSPITAL_COMMUNITY): Payer: Self-pay

## 2020-02-03 DIAGNOSIS — Z79899 Other long term (current) drug therapy: Secondary | ICD-10-CM | POA: Diagnosis not present

## 2020-02-03 DIAGNOSIS — R1084 Generalized abdominal pain: Secondary | ICD-10-CM | POA: Insufficient documentation

## 2020-02-03 DIAGNOSIS — R112 Nausea with vomiting, unspecified: Secondary | ICD-10-CM | POA: Diagnosis not present

## 2020-02-03 MED ORDER — SODIUM CHLORIDE 0.9 % IV BOLUS
1000.0000 mL | Freq: Once | INTRAVENOUS | Status: AC
  Administered 2020-02-03: 1000 mL via INTRAVENOUS

## 2020-02-03 MED ORDER — HALOPERIDOL LACTATE 5 MG/ML IJ SOLN
2.5000 mg | Freq: Once | INTRAMUSCULAR | Status: AC
  Administered 2020-02-03: 11:00:00 2.5 mg via INTRAVENOUS
  Filled 2020-02-03: qty 1

## 2020-02-03 MED ORDER — ONDANSETRON 4 MG PO TBDP: 4.0000 mg | ORAL_TABLET | Freq: Three times a day (TID) | ORAL | 0 refills | Status: DC | PRN

## 2020-02-03 NOTE — Discharge Instructions (Signed)
Your nausea and vomiting is likely either related to marijuana use or potentially some the food.  Try and keep yourself hydrated.  Take nausea medicine as needed.

## 2020-02-03 NOTE — ED Triage Notes (Signed)
Pt bib ems for emesis since 9am, denies abd pain. Hx of cannabis hyperemesis syndrome. Pt does reporting smoking marijuana last night. 4mg  IV zofran given en route

## 2020-02-03 NOTE — ED Notes (Signed)
Patient states he started vomiting this am. States he smoked cannabis last pm and ate hot wings thinks its a combination of both. Actively vomiting at present.

## 2020-02-03 NOTE — ED Provider Notes (Signed)
[facility] Provider Note   CSN: [id number] Arrival date & time: 02/03/20  1021     History Chief Complaint  Patient presents with  . Emesis    [facility] is a 19 y.o. male.  HPI Patient presents with nausea and vomiting.  History of same.  Has both cannabinoid hyperemesis and also thinks he could have been some chicken wings that he ate.  No diarrhea.  Has crampy abdominal pain.  This is similar to previous symptoms he had.  Last smoked marijuana last night.  Otherwise healthy.  No blood in the emesis.  No abdominal swelling.Marland Kitchen   History reviewed. No pertinent past medical history.  There are no problems to display for this patient.   History reviewed. No pertinent surgical history.     Family History  Problem Relation Age of Onset  . Healthy Mother   . Healthy Father     Social History   Tobacco Use  . Smoking status: Never Smoker  . Smokeless tobacco: Never Used  Substance Use Topics  . Alcohol use: No  . Drug use: Yes    Types: Marijuana    Home Medications Prior to Admission medications   Medication Sig Start Date End Date Taking? Authorizing Provider  famotidine (PEPCID) 20 MG tablet Take 1 tablet (20 mg total) by mouth 2 (two) times daily. 01/17/20   Milton Ferguson, MD  ondansetron (ZOFRAN-ODT) 4 MG disintegrating tablet Take 1 tablet (4 mg total) by mouth every 8 (eight) hours as needed for nausea or vomiting. 02/03/20   Davonna Belling, MD    Allergies    Patient has no known allergies.  Review of Systems   Review of Systems  Constitutional: Positive for appetite change. Negative for fever.  HENT: Negative for congestion.   Respiratory: Negative for shortness of breath.   Gastrointestinal: Positive for abdominal pain, nausea and vomiting. Negative for blood in stool.  Genitourinary: Negative for flank pain.  Neurological: Negative for weakness.  Psychiatric/Behavioral: Negative for confusion.     Physical Exam Updated Vital Signs BP (!) 153/92 (BP Location: Right Arm)   Pulse (!) 55   Temp 98.7 F (37.1 C) (Oral)   Resp 14   Ht 6\' 1"  (1.854 m)   Wt 59 kg   SpO2 100%   BMI 17.15 kg/m   Physical Exam Vitals and nursing note reviewed.  HENT:     Head: Normocephalic.     Mouth/Throat:     Mouth: Mucous membranes are moist.  Eyes:     General: No scleral icterus. Cardiovascular:     Rate and Rhythm: Regular rhythm.  Pulmonary:     Breath sounds: No wheezing or rhonchi.  Abdominal:     Comments: Mild diffuse abdominal tenderness without rebound or guarding.  No hernia palpated.  Musculoskeletal:     Cervical back: Neck supple.     Right lower leg: No edema.     Left lower leg: No edema.  Skin:    General: Skin is warm.     Capillary Refill: Capillary refill takes less than 2 seconds.  Neurological:     Mental Status: He is alert and oriented to person, place, and time.     ED Results / Procedures / Treatments   Labs (all labs ordered are listed, but only abnormal results are displayed) Labs Reviewed - No data to display  EKG None  Radiology No results found.  Procedures Procedures (including critical care time)  Medications  Ordered in ED Medications  sodium chloride 0.9 % bolus 1,000 mL (0 mLs Intravenous Stopped 02/03/20 1224)  haloperidol lactate (HALDOL) injection 2.5 mg (2.5 mg Intravenous Given 02/03/20 1120)    ED Course  I have reviewed the triage vital signs and the nursing notes.  Pertinent labs & imaging results that were available during my care of the patient were reviewed by me and considered in my medical decision making (see chart for details).    MDM Rules/Calculators/A&P                      Patient with nausea vomiting.  History of cannabinoid hyperemesis.  Feels better after treatment.  Well-appearing and just had a short case of vomiting.  Will discharge home. Final Clinical Impression(s) / ED Diagnoses Final diagnoses:   Non-intractable vomiting with nausea, unspecified vomiting type    Rx / DC Orders ED Discharge Orders         Ordered    ondansetron (ZOFRAN-ODT) 4 MG disintegrating tablet  Every 8 hours PRN     02/03/20 1226           Benjiman Core, MD 02/03/20 1641

## 2020-02-03 NOTE — ED Notes (Signed)
No vomiting at present states he feels much better.

## 2020-02-10 NOTE — ED Provider Notes (Signed)
[facility] EMERGENCY DEPARTMENT Provider Note   CSN: [id number] Arrival date & time:        History Chief Complaint  Patient presents with  . Nausea  . Numbness    Hand/Feet  . Abdominal Pain    RUQ    Randy Stone is a 19 y.o. male.  19 y.o male with no past medical history presents to the ED with complaints of abdominal pain and vomiting since yesterday.  Patient reports he was evaluated in the ED, did not fill his prescription, had 1 episode of emesis today.  Does have a prior history of marijuana use, reports he has not been using in the past couple of days.  Had a normal work-up yesterday, with stable blood work.  Generalized abdominal pain without focal point of tenderness.  No fever, diarrhea, shortness of breath or chest pain.  The history is provided by the patient.  Abdominal Pain Associated symptoms: nausea and vomiting   Associated symptoms: no chest pain, no diarrhea, no fever and no sore throat        History reviewed. No pertinent past medical history.  There are no problems to display for this patient.   History reviewed. No pertinent surgical history.     Family History  Problem Relation Age of Onset  . Healthy Mother   . Healthy Father     Social History   Tobacco Use  . Smoking status: Never Smoker  . Smokeless tobacco: Never Used  Substance Use Topics  . Alcohol use: No  . Drug use: Yes    Types: Marijuana    Home Medications Prior to Admission medications   Medication Sig Start Date End Date Taking? Authorizing Provider  famotidine (PEPCID) 20 MG tablet Take 1 tablet (20 mg total) by mouth 2 (two) times daily. 01/17/20   Bethann Berkshire, MD  ondansetron (ZOFRAN-ODT) 4 MG disintegrating tablet Take 1 tablet (4 mg total) by mouth every 8 (eight) hours as needed for nausea or vomiting. 02/03/20   Benjiman Core, MD    Allergies    Patient has no known allergies.  Review of Systems   Review of Systems    Constitutional: Negative for fever.  HENT: Negative for sore throat.   Cardiovascular: Negative for chest pain.  Gastrointestinal: Positive for abdominal pain, nausea and vomiting. Negative for diarrhea.  Neurological: Negative for light-headedness and headaches.  All other systems reviewed and are negative.   Physical Exam Updated Vital Signs BP 125/66 (BP Location: Left Arm)   Pulse (!) 55   Temp 98.4 F (36.9 C) (Oral)   Resp 18   Ht 6\' 1"  (1.854 m)   Wt 59 kg   SpO2 95%   BMI 17.16 kg/m   Physical Exam Vitals and nursing note reviewed.  Constitutional:      Appearance: He is well-developed.     Comments: Non-ill-appearing.  HENT:     Head: Normocephalic and atraumatic.  Eyes:     General: No scleral icterus.    Pupils: Pupils are equal, round, and reactive to light.  Cardiovascular:     Heart sounds: Normal heart sounds.  Pulmonary:     Effort: Pulmonary effort is normal.     Breath sounds: Normal breath sounds. No wheezing.  Chest:     Chest wall: No tenderness.  Abdominal:     General: Bowel sounds are normal. There is no distension.     Palpations: Abdomen is soft.     Tenderness: There  is no abdominal tenderness.     Comments: Soft without guarding, rebound.  Bowel sounds are present.  Musculoskeletal:        General: No tenderness or deformity.     Cervical back: Normal range of motion.  Skin:    General: Skin is warm and dry.  Neurological:     Mental Status: He is alert and oriented to person, place, and time.     ED Results / Procedures / Treatments   Labs (all labs ordered are listed, but only abnormal results are displayed) Labs Reviewed  CBC WITH DIFFERENTIAL/PLATELET - Abnormal; Notable for the following components:      Result Value   RBC 6.07 (*)    MCV 79.1 (*)    All other components within normal limits  COMPREHENSIVE METABOLIC PANEL - Abnormal; Notable for the following components:   Glucose, Bld 104 (*)    Creatinine, Ser 1.29  (*)    Total Bilirubin 1.4 (*)    All other components within normal limits  URINALYSIS, ROUTINE W REFLEX MICROSCOPIC - Abnormal; Notable for the following components:   pH 9.0 (*)    Ketones, ur 20 (*)    All other components within normal limits  LIPASE, BLOOD    EKG None  Radiology No results found.  Procedures Procedures (including critical care time)  Medications Ordered in ED Medications  sodium chloride 0.9 % bolus 1,000 mL (0 mLs Intravenous Stopped 12/30/19 1330)  sodium chloride 0.9 % bolus 1,000 mL (0 mLs Intravenous Stopped 12/30/19 1453)    ED Course  I have reviewed the triage vital signs and the nursing notes.  Pertinent labs & imaging results that were available during my care of the patient were reviewed by me and considered in my medical decision making (see chart for details).    MDM Rules/Calculators/A&P    Patient with a past medical history of cyclical vomiting presents to the ED for nausea and one episode of vomiting today.  Evaluated in the ED with a reassuring work-up yesterday, returned today as he did not fill prescription.  Given Zofran on arrival to the ED.  Vitals are within normal limits, no signs of hypotension, dizziness, other complaints.  Labs reevaluated from yesterday without any acute finding.  Patient is tolerating drinks as needed.  Labs were reordered, CBC was unremarkable, no electrolyte deficit.  He is tolerating fluids adequately.  Patient is requesting discharge, patient stable for discharge.    Portions of this note were generated with Lobbyist. Dictation errors may occur despite best attempts at proofreading.  Final Clinical Impression(s) / ED Diagnoses Final diagnoses:  Generalized abdominal pain    Rx / DC Orders ED Discharge Orders    None       Janeece Fitting, PA-C 02/10/20 1429    Malvin Johns, MD 02/13/20 1758

## 2020-04-18 ENCOUNTER — Emergency Department (HOSPITAL_COMMUNITY)
Admission: EM | Admit: 2020-04-18 | Discharge: 2020-04-18 | Disposition: A | Discharge status: Home / Self Care | Payer: Medicaid Other | Attending: Emergency Medicine | Admitting: Emergency Medicine

## 2020-04-18 ENCOUNTER — Encounter (HOSPITAL_COMMUNITY): Payer: Self-pay | Admitting: Emergency Medicine

## 2020-04-18 DIAGNOSIS — F1210 Cannabis abuse, uncomplicated: Secondary | ICD-10-CM | POA: Diagnosis not present

## 2020-04-18 DIAGNOSIS — Z79899 Other long term (current) drug therapy: Secondary | ICD-10-CM | POA: Diagnosis not present

## 2020-04-18 DIAGNOSIS — R6883 Chills (without fever): Secondary | ICD-10-CM | POA: Diagnosis not present

## 2020-04-18 DIAGNOSIS — R197 Diarrhea, unspecified: Secondary | ICD-10-CM | POA: Insufficient documentation

## 2020-04-18 DIAGNOSIS — R112 Nausea with vomiting, unspecified: Secondary | ICD-10-CM | POA: Diagnosis not present

## 2020-04-18 LAB — COMPREHENSIVE METABOLIC PANEL
ALT: 26 U/L (ref 0–44)
AST: 30 U/L (ref 15–41)
Albumin: 5 g/dL (ref 3.5–5.0)
Alkaline Phosphatase: 74 U/L (ref 38–126)
Anion gap: 17 — ABNORMAL HIGH (ref 5–15)
BUN: 10 mg/dL (ref 6–20)
CO2: 20 mmol/L — ABNORMAL LOW (ref 22–32)
Calcium: 10.4 mg/dL — ABNORMAL HIGH (ref 8.9–10.3)
Chloride: 103 mmol/L (ref 98–111)
Creatinine, Ser: 1.25 mg/dL — ABNORMAL HIGH (ref 0.61–1.24)
GFR calc Af Amer: >60 mL/min (ref >60)
GFR calc non Af Amer: >60 mL/min (ref >60)
Glucose, Bld: 139 mg/dL — ABNORMAL HIGH (ref 70–99)
Potassium: 3.8 mmol/L (ref 3.5–5.1)
Sodium: 140 mmol/L (ref 135–145)
Total Bilirubin: 1.3 mg/dL — ABNORMAL HIGH (ref 0.3–1.2)
Total Protein: 8 g/dL (ref 6.5–8.1)

## 2020-04-18 LAB — URINALYSIS, ROUTINE W REFLEX MICROSCOPIC
Bilirubin Urine: NEGATIVE
Glucose, UA: NEGATIVE mg/dL
Hgb urine dipstick: NEGATIVE
Ketones, ur: 20 mg/dL — AB
Leukocytes,Ua: NEGATIVE
Nitrite: NEGATIVE
Protein, ur: NEGATIVE mg/dL
Specific Gravity, Urine: 1.021 (ref 1.005–1.030)
pH: 8 (ref 5.0–8.0)

## 2020-04-18 LAB — CBC
HCT: 53.6 % — ABNORMAL HIGH (ref 39.0–52.0)
Hemoglobin: 17.3 g/dL — ABNORMAL HIGH (ref 13.0–17.0)
MCH: 26.2 pg (ref 26.0–34.0)
MCHC: 32.3 g/dL (ref 30.0–36.0)
MCV: 81.2 fL (ref 80.0–100.0)
Platelets: 220 10*3/uL (ref 150–400)
RBC: 6.6 MIL/uL — ABNORMAL HIGH (ref 4.22–5.81)
RDW: 13.3 % (ref 11.5–15.5)
WBC: 10 10*3/uL (ref 4.0–10.5)
nRBC: 0 % (ref 0.0–0.2)

## 2020-04-18 LAB — LIPASE, BLOOD: Lipase: 22 U/L (ref 11–51)

## 2020-04-18 MED ORDER — ONDANSETRON 4 MG PO TBDP
4.0000 mg | ORAL_TABLET | Freq: Once | ORAL | Status: DC | PRN
  Filled 2020-04-18: qty 1

## 2020-04-18 MED ORDER — ONDANSETRON HCL 4 MG/2ML IJ SOLN
4.0000 mg | Freq: Once | INTRAMUSCULAR | Status: AC
  Administered 2020-04-18: 4 mg via INTRAVENOUS
  Filled 2020-04-18: qty 2

## 2020-04-18 MED ORDER — FAMOTIDINE 20 MG PO TABS
20.0000 mg | ORAL_TABLET | Freq: Two times a day (BID) | ORAL | 0 refills | Status: DC
Start: 2020-04-18 — End: 2022-06-11

## 2020-04-18 MED ORDER — SODIUM CHLORIDE 0.9 % IV BOLUS
1000.0000 mL | Freq: Once | INTRAVENOUS | Status: AC
  Administered 2020-04-18: 1000 mL via INTRAVENOUS

## 2020-04-18 MED ORDER — ONDANSETRON 4 MG PO TBDP
4.0000 mg | ORAL_TABLET | Freq: Three times a day (TID) | ORAL | 0 refills | Status: DC | PRN
Start: 2020-04-18 — End: 2022-06-11

## 2020-04-18 MED ORDER — ACETAMINOPHEN 500 MG PO TABS
1000.0000 mg | ORAL_TABLET | Freq: Once | ORAL | Status: AC
  Administered 2020-04-18: 1000 mg via ORAL
  Filled 2020-04-18: qty 2

## 2020-04-18 MED ORDER — LORAZEPAM 1 MG PO TABS: 1.0000 mg | ORAL_TABLET | Freq: Once | ORAL | Status: DC

## 2020-04-18 MED ORDER — SODIUM CHLORIDE 0.9% FLUSH: 3.0000 mL | Freq: Once | INTRAVENOUS | Status: DC

## 2020-04-18 NOTE — Discharge Instructions (Signed)
Avoid marijuana use as it may contribute to your symptoms.  Take medications prescribed as needed.  Return if you have any concerns.

## 2020-04-18 NOTE — ED Triage Notes (Signed)
Pt here with c/o n/v , pt admits to smoking some weed and has had n/v from that before

## 2020-04-18 NOTE — ED Provider Notes (Signed)
[facility] EMERGENCY DEPARTMENT Provider Note   CSN: [id number] Arrival date & time: 04/18/20  6283     History No chief complaint on file.   Randy Stone is a 19 y.o. male.  The history is provided by the patient and medical records. No language interpreter was used.     19 year old male with history of marijuana use presenting for evaluation of nausea and vomiting.  Patient reports since yesterday he has had persistent nausea, occasional episodes of vomiting, as well as having some loose stools.  Nausea is moderate in severity, he vomited once yesterday and several times today.  Vomitus is nonbloody nonbilious.  Mild abdominal irritation from persistent vomiting.  Endorse some chills but denies having fever, chest pain, shortness of breath, productive cough, dysuria, or hematuria.  He does admits to marijuana use, last use was yesterday and states he has had similar nausea and vomiting related to marijuana use.  He denies any recent sick contact.  He does not complain of any significant abdominal pain.  He denies any recent antibiotic use or eating exotic food.  He report receiving Zofran a bit earlier which provides some relief.  He denies any other treatment tried.  Denies any recent alcohol use.  History reviewed. No pertinent past medical history.  There are no problems to display for this patient.   History reviewed. No pertinent surgical history.     Family History  Problem Relation Age of Onset  . Healthy Mother   . Healthy Father     Social History   Tobacco Use  . Smoking status: Never Smoker  . Smokeless tobacco: Never Used  Vaping Use  . Vaping Use: Never used  Substance Use Topics  . Alcohol use: No  . Drug use: Yes    Types: Marijuana    Home Medications Prior to Admission medications   Medication Sig Start Date End Date Taking? Authorizing Provider  famotidine (PEPCID) 20 MG tablet Take 1 tablet (20 mg total) by mouth 2 (two)  times daily. 01/17/20   Bethann Berkshire, MD  ondansetron (ZOFRAN-ODT) 4 MG disintegrating tablet Take 1 tablet (4 mg total) by mouth every 8 (eight) hours as needed for nausea or vomiting. 02/03/20   Benjiman Core, MD    Allergies    Patient has no known allergies.  Review of Systems   Review of Systems  All other systems reviewed and are negative.   Physical Exam Updated Vital Signs BP (!) 142/67 (BP Location: Left Arm)   Pulse 62   Temp 97.8 F (36.6 C) (Oral)   Resp 20   SpO2 100%   Physical Exam Vitals and nursing note reviewed.  Constitutional:      General: He is not in acute distress.    Appearance: He is well-developed.  HENT:     Head: Atraumatic.  Eyes:     Conjunctiva/sclera: Conjunctivae normal.  Cardiovascular:     Rate and Rhythm: Normal rate and regular rhythm.     Pulses: Normal pulses.     Heart sounds: Normal heart sounds.  Pulmonary:     Effort: Pulmonary effort is normal.     Breath sounds: Normal breath sounds.  Abdominal:     Palpations: Abdomen is soft.     Tenderness: There is abdominal tenderness (Mild epigastric tenderness no guarding or rebound tenderness.  Negative Murphy sign, no pain at McBurney's point.).  Musculoskeletal:     Cervical back: Neck supple.  Skin:    Findings:  No rash.  Neurological:     Mental Status: He is alert and oriented to person, place, and time.  Psychiatric:        Mood and Affect: Mood normal.     ED Results / Procedures / Treatments   Labs (all labs ordered are listed, but only abnormal results are displayed) Labs Reviewed  COMPREHENSIVE METABOLIC PANEL - Abnormal; Notable for the following components:      Result Value   CO2 20 (*)    Glucose, Bld 139 (*)    Creatinine, Ser 1.25 (*)    Calcium 10.4 (*)    Total Bilirubin 1.3 (*)    Anion gap 17 (*)    All other components within normal limits  CBC - Abnormal; Notable for the following components:   RBC 6.60 (*)    Hemoglobin 17.3 (*)    HCT  53.6 (*)    All other components within normal limits  URINALYSIS, ROUTINE W REFLEX MICROSCOPIC - Abnormal; Notable for the following components:   Ketones, ur 20 (*)    All other components within normal limits  LIPASE, BLOOD    EKG None  Radiology No results found.  Procedures Procedures (including critical care time)  Medications Ordered in ED Medications  sodium chloride flush (NS) 0.9 % injection 3 mL (has no administration in time range)  ondansetron (ZOFRAN-ODT) disintegrating tablet 4 mg (has no administration in time range)    ED Course  I have reviewed the triage vital signs and the nursing notes.  Pertinent labs & imaging results that were available during my care of the patient were reviewed by me and considered in my medical decision making (see chart for details).    MDM Rules/Calculators/A&P                          BP (!) 142/67 (BP Location: Left Arm)   Pulse 62   Temp 97.8 F (36.6 C) (Oral)   Resp 20   SpO2 100%   Final Clinical Impression(s) / ED Diagnoses Final diagnoses:  Nausea vomiting and diarrhea    Rx / DC Orders ED Discharge Orders         Ordered    ondansetron (ZOFRAN-ODT) 4 MG disintegrating tablet  Every 8 hours PRN     Discontinue  Reprint     04/18/20 1602    famotidine (PEPCID) 20 MG tablet  2 times daily     Discontinue  Reprint     04/18/20 1602         2:07 PM Patient complained of nausea and vomiting, last bowel movement was earlier today.  This is likely in the setting of marijuana use.  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  Has a fairly benign abdominal exam.  UA without signs of urine tract infection.  20 ketones were noted.  Mild AKI with a creatinine of 1.25.  Hemoglobin of 17.3, likely hemoconcentration.  Will give Zofran, as well as IV fluid.  Encourage patient to avoid marijuana use.  Doubt biliary disease.  4:04 PM Pt received antiemetic and IVF.  Currently improvement of sxs.  Tolerates PO.  Has a  headache, tylenol given.  Stable for discharge.  Recommend avoidance of marijuana use.     Fayrene Helper, PA-C 04/18/20 1604    Arby Barrette, MD 04/19/20 [phone]

## 2020-04-20 ENCOUNTER — Emergency Department (HOSPITAL_COMMUNITY)
Admission: EM | Admit: 2020-04-20 | Discharge: 2020-04-20 | Disposition: A | Discharge status: Home / Self Care | Payer: Medicaid Other | Attending: Emergency Medicine | Admitting: Emergency Medicine

## 2020-04-20 ENCOUNTER — Encounter (HOSPITAL_COMMUNITY): Payer: Self-pay | Admitting: Pediatrics

## 2020-04-20 ENCOUNTER — Other Ambulatory Visit: Payer: Self-pay

## 2020-04-20 DIAGNOSIS — Z5321 Procedure and treatment not carried out due to patient leaving prior to being seen by health care provider: Secondary | ICD-10-CM | POA: Diagnosis not present

## 2020-04-20 DIAGNOSIS — R112 Nausea with vomiting, unspecified: Secondary | ICD-10-CM | POA: Insufficient documentation

## 2020-04-20 LAB — CBC
HCT: 49.6 % (ref 39.0–52.0)
Hemoglobin: 16.4 g/dL (ref 13.0–17.0)
MCH: 26.2 pg (ref 26.0–34.0)
MCHC: 33.1 g/dL (ref 30.0–36.0)
MCV: 79.4 fL — ABNORMAL LOW (ref 80.0–100.0)
Platelets: 182 10*3/uL (ref 150–400)
RBC: 6.25 MIL/uL — ABNORMAL HIGH (ref 4.22–5.81)
RDW: 13 % (ref 11.5–15.5)
WBC: 8.6 10*3/uL (ref 4.0–10.5)
nRBC: 0 % (ref 0.0–0.2)

## 2020-04-20 LAB — COMPREHENSIVE METABOLIC PANEL
ALT: 28 U/L (ref 0–44)
AST: 27 U/L (ref 15–41)
Albumin: 4.6 g/dL (ref 3.5–5.0)
Alkaline Phosphatase: 67 U/L (ref 38–126)
Anion gap: 16 — ABNORMAL HIGH (ref 5–15)
BUN: 13 mg/dL (ref 6–20)
CO2: 22 mmol/L (ref 22–32)
Calcium: 10.2 mg/dL (ref 8.9–10.3)
Chloride: 103 mmol/L (ref 98–111)
Creatinine, Ser: 1.34 mg/dL — ABNORMAL HIGH (ref 0.61–1.24)
GFR calc Af Amer: >60 mL/min (ref >60)
GFR calc non Af Amer: >60 mL/min (ref >60)
Glucose, Bld: 117 mg/dL — ABNORMAL HIGH (ref 70–99)
Potassium: 3.3 mmol/L — ABNORMAL LOW (ref 3.5–5.1)
Sodium: 141 mmol/L (ref 135–145)
Total Bilirubin: 1.7 mg/dL — ABNORMAL HIGH (ref 0.3–1.2)
Total Protein: 7.7 g/dL (ref 6.5–8.1)

## 2020-04-20 LAB — LIPASE, BLOOD: Lipase: 21 U/L (ref 11–51)

## 2020-04-20 MED ORDER — SODIUM CHLORIDE 0.9% FLUSH: 3.0000 mL | Freq: Once | INTRAVENOUS | Status: DC

## 2020-04-20 NOTE — ED Triage Notes (Signed)
Stated he was seen for N + V 2 days ago, resolved yesterday and symptoms are back.

## 2020-09-15 ENCOUNTER — Other Ambulatory Visit: Payer: Self-pay

## 2020-09-15 ENCOUNTER — Emergency Department (HOSPITAL_COMMUNITY)
Admission: EM | Admit: 2020-09-15 | Discharge: 2020-09-15 | Disposition: A | Discharge status: Home / Self Care | Payer: Medicaid Other | Attending: Emergency Medicine | Admitting: Emergency Medicine

## 2020-09-15 ENCOUNTER — Emergency Department (HOSPITAL_COMMUNITY): Payer: Medicaid Other

## 2020-09-15 ENCOUNTER — Encounter (HOSPITAL_COMMUNITY): Payer: Self-pay | Admitting: Emergency Medicine

## 2020-09-15 DIAGNOSIS — S63282A Dislocation of proximal interphalangeal joint of right middle finger, initial encounter: Secondary | ICD-10-CM | POA: Diagnosis not present

## 2020-09-15 DIAGNOSIS — Y92410 Unspecified street and highway as the place of occurrence of the external cause: Secondary | ICD-10-CM | POA: Insufficient documentation

## 2020-09-15 DIAGNOSIS — S8261XA Displaced fracture of lateral malleolus of right fibula, initial encounter for closed fracture: Secondary | ICD-10-CM | POA: Insufficient documentation

## 2020-09-15 DIAGNOSIS — S99911A Unspecified injury of right ankle, initial encounter: Secondary | ICD-10-CM | POA: Diagnosis present

## 2020-09-15 DIAGNOSIS — S63289A Dislocation of proximal interphalangeal joint of unspecified finger, initial encounter: Secondary | ICD-10-CM

## 2020-09-15 MED ORDER — LIDOCAINE HCL (PF) 1 % IJ SOLN
5.0000 mL | Freq: Once | INTRAMUSCULAR | Status: AC
  Administered 2020-09-15: 04:00:00 5 mL via INTRADERMAL
  Filled 2020-09-15: qty 30

## 2020-09-15 MED ORDER — HYDROCODONE-ACETAMINOPHEN 5-325 MG PO TABS
1.0000 | ORAL_TABLET | Freq: Once | ORAL | Status: AC
  Administered 2020-09-15: 03:00:00 1 via ORAL
  Filled 2020-09-15: qty 1
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
## 2020-09-15 NOTE — ED Notes (Signed)
Patient transported to X-ray 

## 2020-09-15 NOTE — ED Notes (Signed)
Patient transported to CT 

## 2020-09-15 NOTE — ED Notes (Signed)
Spoke with guardian of patient, states that he should take a cab to house and she would pay for it.
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
## 2020-09-15 NOTE — Discharge Instructions (Signed)
For pain control you may take at 1000 mg of Tylenol every 8 hours scheduled.    

## 2020-10-12 ENCOUNTER — Ambulatory Visit: Payer: Medicaid Other

## 2020-10-22 ENCOUNTER — Ambulatory Visit: Payer: Medicaid Other | Attending: Orthopaedic Surgery

## 2020-10-31 ENCOUNTER — Ambulatory Visit: Payer: Medicaid Other | Attending: Orthopaedic Surgery | Admitting: Physical Therapy

## 2020-10-31 ENCOUNTER — Other Ambulatory Visit: Payer: Self-pay

## 2020-10-31 ENCOUNTER — Encounter: Payer: Self-pay | Admitting: Physical Therapy

## 2020-10-31 DIAGNOSIS — M6281 Muscle weakness (generalized): Secondary | ICD-10-CM | POA: Diagnosis present

## 2020-10-31 DIAGNOSIS — M25571 Pain in right ankle and joints of right foot: Secondary | ICD-10-CM | POA: Insufficient documentation
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
## 2020-11-14 ENCOUNTER — Other Ambulatory Visit: Payer: Self-pay

## 2020-11-14 ENCOUNTER — Ambulatory Visit: Payer: Medicaid Other | Admitting: Physical Therapy

## 2020-11-14 ENCOUNTER — Encounter: Payer: Self-pay | Admitting: Physical Therapy

## 2020-11-14 DIAGNOSIS — M25571 Pain in right ankle and joints of right foot: Secondary | ICD-10-CM

## 2020-11-14 DIAGNOSIS — M6281 Muscle weakness (generalized): Secondary | ICD-10-CM
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
## 2020-11-20 ENCOUNTER — Other Ambulatory Visit: Payer: Self-pay

## 2020-11-20 ENCOUNTER — Ambulatory Visit: Payer: Medicaid Other | Admitting: Physical Therapy

## 2020-11-20 ENCOUNTER — Encounter: Payer: Self-pay | Admitting: Physical Therapy

## 2020-11-20 DIAGNOSIS — M25571 Pain in right ankle and joints of right foot: Secondary | ICD-10-CM

## 2020-11-20 DIAGNOSIS — M6281 Muscle weakness (generalized): Secondary | ICD-10-CM

## 2020-11-20 NOTE — Therapy (Addendum)
[place], [place], [address] Phone: [phone]   Fax:  [phone]  Physical Therapy Treatment / Discharge note  Patient Details  Name: Randy Stone MRN: [id number] Date of Birth: September 14, 2001 Referring Provider (PT): Randy Crocker, MD   Encounter Date: 11/20/2020   PT End of Session - 11/20/20 1107    Visit Number 3    Number of Visits 5    Date for PT Re-Evaluation 12/05/20    Authorization Type MCD - submitted on 10/31/2020    Authorization Time Period 11/14/20-12/04/20    Authorization - Visit Number 2    Authorization - Number of Visits 3    PT Start Time 1102    PT Stop Time 1140    PT Time Calculation (min) 38 min    Activity Tolerance Patient tolerated treatment well    Behavior During Therapy [facility] for tasks assessed/performed           History reviewed. No pertinent past medical history.  History reviewed. No pertinent surgical history.  There were no vitals filed for this visit.   Subjective Assessment - 11/20/20 1106    Subjective I played basketball on Sunday for 1.5 hours with no pain during or afterward.    Diagnostic tests CT scan 09/15/2020 IMPRESSION:  Small bone fragments off the tip of the lateral malleolus are  compatible with age indeterminate avulsion fracture. No other  evidence of fracture is identified.     [place].    Patient Stated Goals to be able to stand and walk, improve strength, play basketball, and go back to work.    Currently in Pain? No/denies              [facility] PT Assessment - 11/20/20 0001      AROM   Right Ankle Dorsiflexion 12    Right Ankle Plantar Flexion 55      Strength   Right Ankle Dorsiflexion 5/5    Right Ankle Plantar Flexion --   22/25   Right Ankle Inversion 5/5    Right Ankle Eversion 5/5    Left Ankle Plantar Flexion --   25/25                        [facility] Adult PT Treatment/Exercise -  11/20/20 0001      Ankle Exercises: Standing   Heel Raises Limitations single heel raise 22/25    Other Standing Ankle Exercises single leg squat to cone tap x 20, added foam oval for challenge- min cues for form    Other Standing Ankle Exercises oval SLS with 3 way hip      Ankle Exercises: Plyometrics   Bilateral Jumping 10 reps;2 sets   squat jumps then box jumps   Plyometric Exercises Hopping in place, hopping side to side, hopping forwrad and back   20 sec each   Plyometric Exercises skipping 4 2 x5 feet      Ankle Exercises: Stretches   Slant Board Stretch Limitations gastroc and soleus on slant board x 60 sec each      Ankle Exercises: Aerobic   Elliptical L3 Ramp 3 x 6 minutes                    PT Short Term Goals - 11/14/20 1142      PT SHORT TERM GOAL #1   Title pt to be Ind with intial HEP  Time 2    Period Weeks    Status Achieved             PT Long Term Goals - 11/20/20 1138      PT LONG TERM GOAL #1   Title increae R ankle DF to >/= 8 degrees and PF to >/= 50 degrees for functional ROM required for gait efficency    Baseline 12-55 right ankle DF-PF    Period Weeks    Status Achieved      PT LONG TERM GOAL #2   Title pt to increase gross R ankle strength to >/= 4+/5 to promote ankle stability with prolonged walking/ standing    Baseline gross ankle strength on R 4+//5 11/20/20    Time 4    Period Weeks    Status Achieved      PT LONG TERM GOAL #3   Title pt to be able to walk/ stand for >/= 2 hours with </= 2/10 max pain for improvement in function    Baseline no pain with walking in park    Time 4    Period Weeks    Status Achieved      PT LONG TERM GOAL #4   Title pt to be able to perform dynamic/ plyometric activities with no report of pain or instability to return to playing basketball per perosnal goals.    Baseline played basketball for 1.5 hours without pain, advanced plyometrics today without pain.    Time 4    Period Weeks     Status Partially Met      PT LONG TERM GOAL #5   Title pt to be IND with all HEP given and is able to maintain and progress current LOF IND.    Baseline independent thus far    Time 4    Period Weeks    Status On-going                 Plan - 11/20/20 1131    Clinical Impression Statement Pt reports playing basketball for 1.5 hours without increased pain. PF strength improved, able to perform 22 single heel raises on right. Advanced plyometric activity today with no increased ankle pain. He reported feeling some soreness in hamstring , otherwise tolerated all therex well without pain. He has met LTG#1,2,3. he would benefit from one more F/U session to assess response to this treatment and finalize HEP. Will likely discharge next session if no adverse effects.    PT Next Visit Plan Possible discharge to HEP; check goals,  assess respone to plyo; finalize HEP gross ankle strengthening/ endurance training, balance training/ stability,    PT Home Exercise Plan [email or web address] - ankle inversion/ eversion and DF with band, standing heel raise, soleus/ gatroc stretch, towel scrunch, added SLS on towel with 3 way hip, added single heel raise.           Patient will benefit from skilled therapeutic intervention in order to improve the following deficits and impairments:  Improper body mechanics,Decreased strength,Pain,Decreased endurance,Decreased activity tolerance,Decreased range of motion  Visit Diagnosis: Pain in right ankle and joints of right foot  Muscle weakness (generalized)     Problem List There are no problems to display for this patient.   Randy Stone, [place] 11/20/2020, 11:45 AM  [facility] [address] [place], [place], [address] Phone: [phone]   Fax:  [phone]  Name: Randy Stone MRN: [id number] Date of Birth: [birth year]  PHYSICAL THERAPY DISCHARGE SUMMARY  Visits from Start of Care:  3  Current functional level related to goals / functional outcomes: See goals   Remaining deficits: Current status unknown   Education / Equipment: HEP  Plan: Patient agrees to discharge.  Patient goals were not met. Patient is being discharged due to not returning since the last visit.  ?????        Randy Stone PT, DPT, LAT, ATC  12/24/20  11:21 AM      

## 2020-11-26 ENCOUNTER — Ambulatory Visit: Payer: Medicaid Other | Admitting: Physical Therapy

## 2020-11-26 ENCOUNTER — Telehealth: Payer: Self-pay | Admitting: Physical Therapy

## 2020-11-26 NOTE — Telephone Encounter (Signed)
LVM regarding missed appointment today. Today was the last scheduled appointment and per the last treatment note he was doing well. If he wanted one more visit scheduled vs discharge to call us back and let us know.  Kayline Sheer PT, DPT, LAT, ATC  11/26/20  11:36 AM

## 2021-04-01 IMAGING — CR DG ABDOMEN ACUTE W/ 1V CHEST
3 series · 3 of 3 positions shown · non-contrast
Comparison: 08/23/2016

CLINICAL DATA: Vomiting

EXAM:
DG ABDOMEN ACUTE W/ 1V CHEST

[w chest pa]
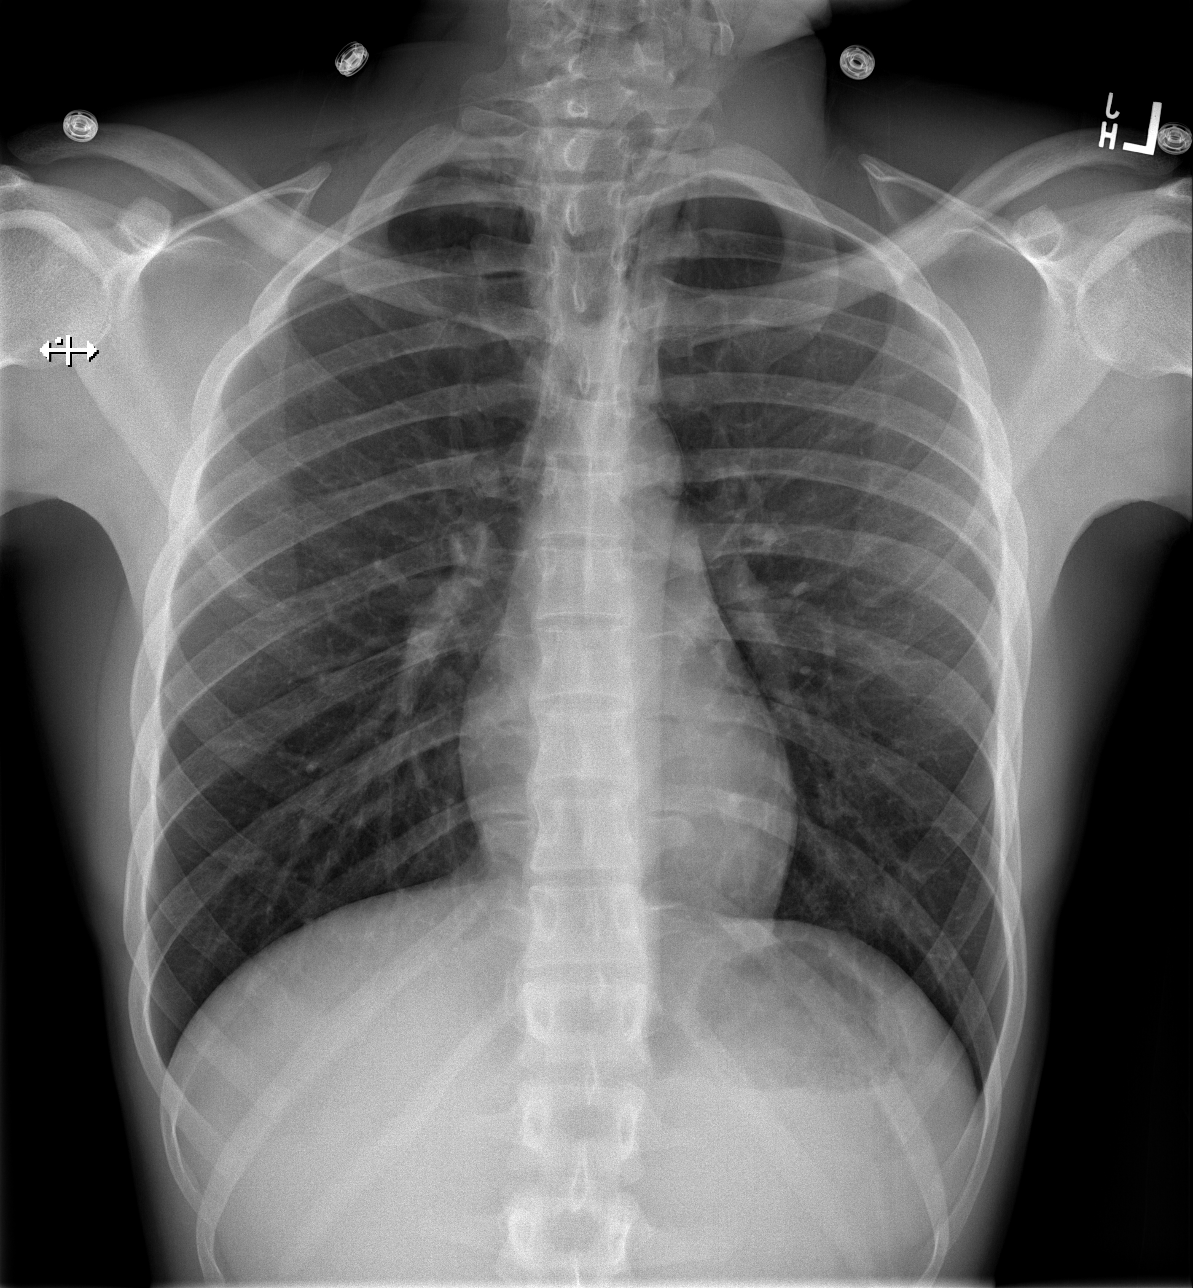

[w abdomen upright]
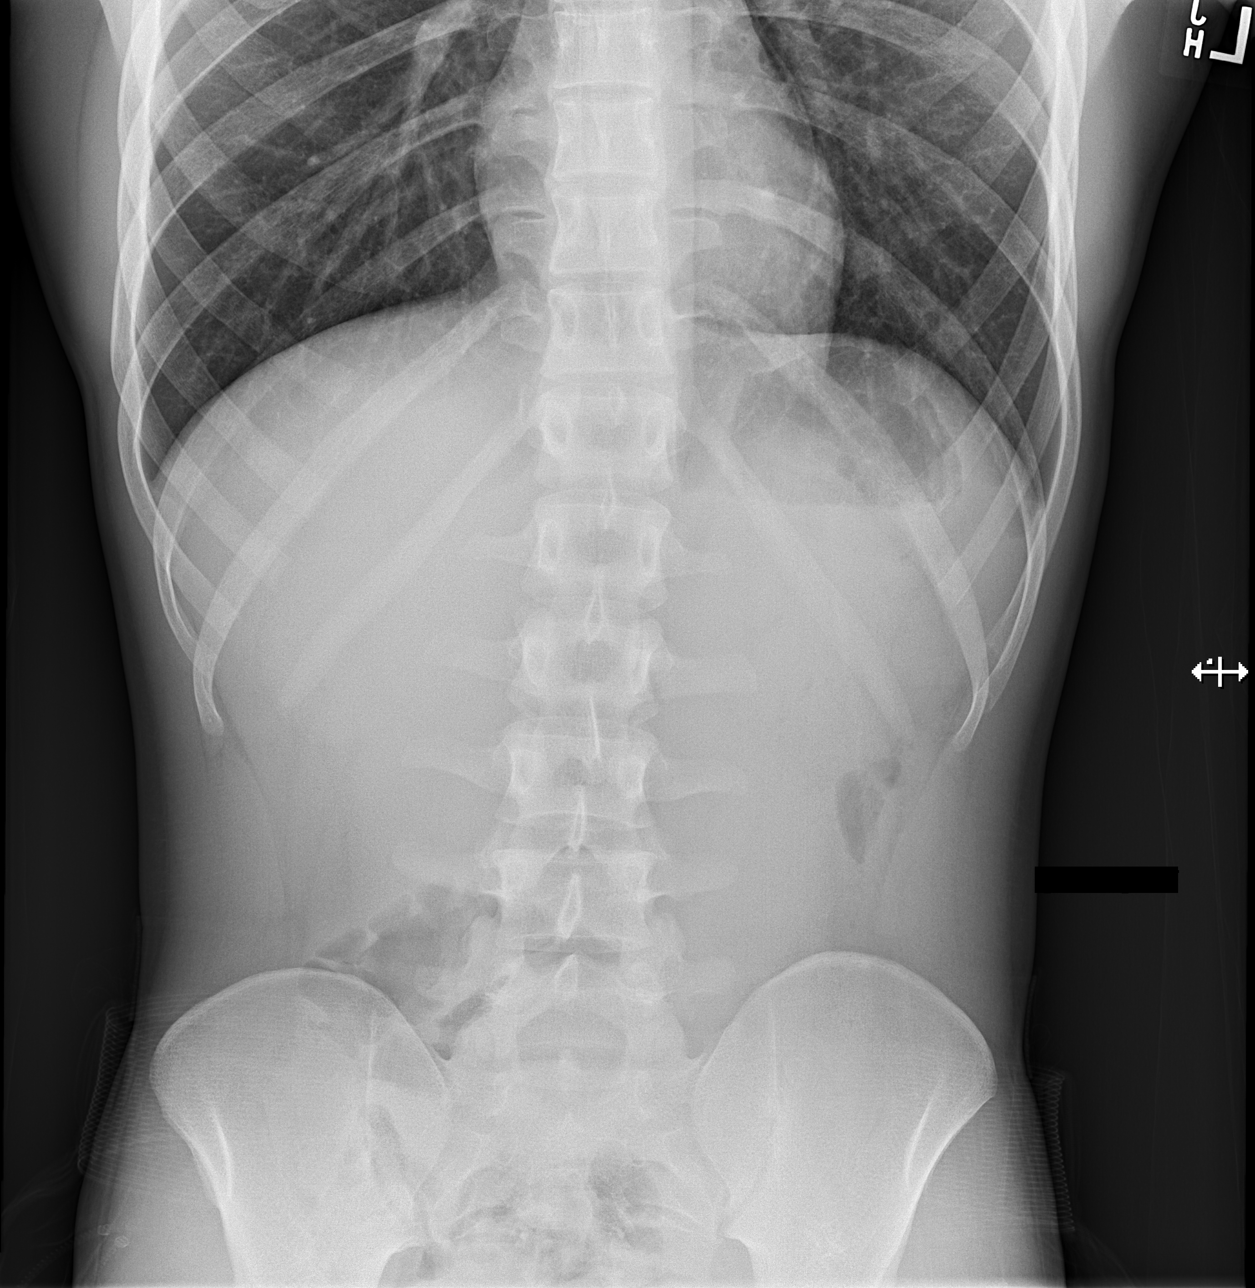

[t abdomen supine]
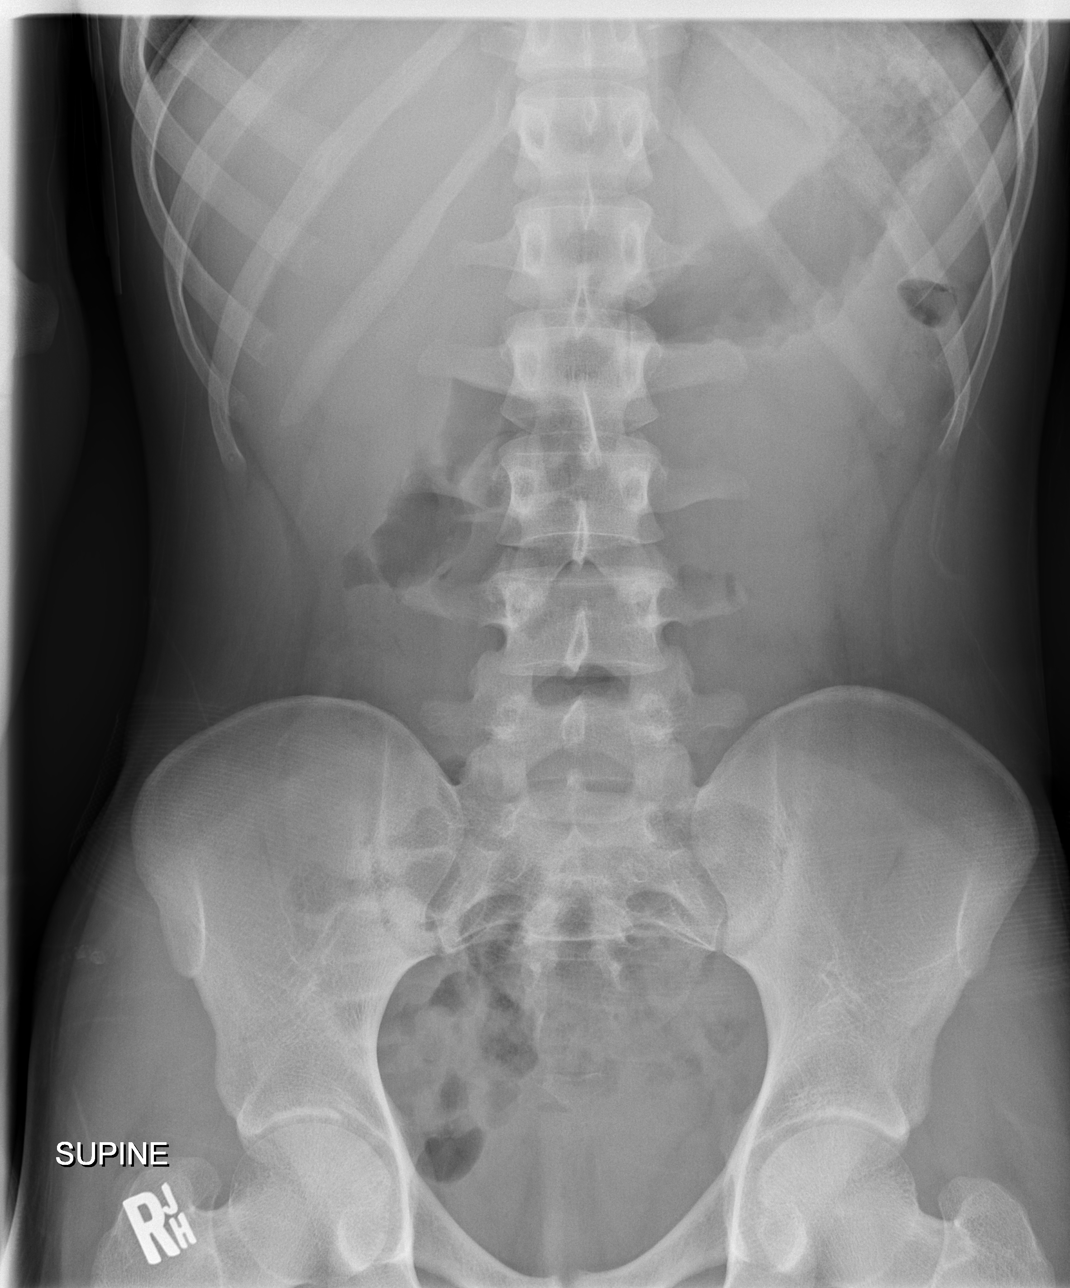

[3 of 3 positions shown; findings below may reference images not displayed]

FINDINGS: There is no evidence of dilated bowel loops or free intraperitoneal
air. No radiopaque calculi or other significant radiographic
abnormality is seen. Heart size and mediastinal contours are within
normal limits. Both lungs are clear.
IMPRESSION: Negative abdominal radiographs.  No acute cardiopulmonary disease.
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
## 2022-06-10 NOTE — Patient Instructions (Signed)
Thank you for attending your appointment today.  -- START Zoloft 25 mg daily for 1 week then INCREASE to 50 mg daily -- START Prazosin 1 mg nightly  Please do not make any changes to medications without first discussing with your provider. If you are experiencing a psychiatric emergency, please call 911 or present to your nearest emergency department. Additional crisis, medication management, and therapy resources are included below.  [facility]  [address], [place], [place] [address] [phone] or [phone] [facility] 24/7 FOR ANYONE [address] [place], [place]  [phone] Fax: [phone] [email or web address] *Interpreters available *Accepts all insurance and uninsured for Urgent Care needs *Accepts Medicaid and uninsured for outpatient treatment (below)      ONLY FOR [facility]  Below:    Outpatient New Patient Assessment/Therapy Walk-ins:        Monday -Thursday 8am until slots are full.        Every Friday 1pm-4pm  (first come, first served)                   New Patient Psychiatry/Medication Management        Monday-Friday 8am-11am (first come, first served)               For all walk-ins we ask that you arrive by 7:15am, because patients will be seen in the order of arrival.

## 2022-06-11 ENCOUNTER — Ambulatory Visit (INDEPENDENT_AMBULATORY_CARE_PROVIDER_SITE_OTHER): Payer: Medicaid Other | Admitting: Psychiatry

## 2022-06-11 ENCOUNTER — Encounter (HOSPITAL_COMMUNITY): Payer: Self-pay | Admitting: Psychiatry

## 2022-06-11 ENCOUNTER — Ambulatory Visit (INDEPENDENT_AMBULATORY_CARE_PROVIDER_SITE_OTHER): Payer: Medicaid Other | Admitting: Mental Health

## 2022-06-11 ENCOUNTER — Encounter (HOSPITAL_COMMUNITY): Payer: Self-pay

## 2022-06-11 DIAGNOSIS — F4310 Post-traumatic stress disorder, unspecified: Secondary | ICD-10-CM | POA: Insufficient documentation

## 2022-06-11 DIAGNOSIS — F322 Major depressive disorder, single episode, severe without psychotic features: Secondary | ICD-10-CM

## 2022-06-11 MED ORDER — SERTRALINE HCL 50 MG PO TABS: ORAL_TABLET | ORAL | 2 refills | Status: DC

## 2022-06-11 MED ORDER — PRAZOSIN HCL 1 MG PO CAPS: 1.0000 mg | ORAL_CAPSULE | Freq: Every evening | ORAL | 2 refills | Status: DC
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
## 2022-06-11 NOTE — Plan of Care (Signed)
  Problem: Depression CCP Problem  1 MDD severe  Goal: LTG: Angelos WILL SCORE LESS THAN 10 ON THE PATIENT HEALTH QUESTIONNAIRE (PHQ-9) Outcome: Initial Goal: STG: Yunis decrease sxs of depression AEB development of x 3 effective coping skills within the next 6 months  Outcome: Initial Goal: STG: Creighton will decrease sxs of depression AEB ability to reframe maladaptive thinking patterns 7/7 days within the next 6 months  Outcome: Initial   Problem: PTSD-Trauma Disorder CCP Problem  1 PTSD Goal: LTG: Karsen will Reduce frequency, intensity, and duration of PTSD symptoms so daily functioning is improved AEB PCL score of 19 or less  Outcome: Initial Goal: STG: Fenton will effectively process thoughts and feelings related to past traumas AEB ability to identify and reframe trauma stuck points within the next 6 months  Outcome: Initial Goal: STG: Darus will reduce feelings of anxiety AEB development of x 3 effective grounding and relaxation coping skills within the next 6 months  Outcome: Initial   

## 2022-06-24 ENCOUNTER — Ambulatory Visit (INDEPENDENT_AMBULATORY_CARE_PROVIDER_SITE_OTHER): Payer: Medicaid Other | Admitting: Mental Health

## 2022-06-24 DIAGNOSIS — F322 Major depressive disorder, single episode, severe without psychotic features: Secondary | ICD-10-CM

## 2022-06-24 DIAGNOSIS — F4310 Post-traumatic stress disorder, unspecified: Secondary | ICD-10-CM

## 2022-06-24 NOTE — Progress Notes (Unsigned)
THERAPIST PROGRESS NOTE  Session Time: 4:30pm (55 minutes)   Participation Level: Active  Behavioral Response: DisheveledAlertDysphoric  Type of Therapy: Individual Therapy  Treatment Goals addressed: STG: Randy Stone decrease sxs of depression AEB development of x 3 effective coping skills within the next 6 months   ProgressTowards Goals: Initial  Interventions: CBT and Supportive  Summary: Randy Stone is a 21 y.o. male who presents with dx of severe major depression and PTSD. Randy Stone presents to session alert and oriented; mood and affect low; depressed. Speech clear and coherent at normal rate and tone. Reduced spontaneous content. Malodorous of cannabis smoke. Engaged and receptive to interventions. Randy Stone shares to feel as if he has had somewhat decrease in depressive sxs and shares to be spending time with girlfriend in car throughout the day.Shares to go to sleep around 11 at cousins home in which he lives then wakes up and sits in car while girlfriend is in classes. Shares liking to play basketball and video games but declines to play video games in home due to not wanting to increase electric bill at cousin's home. Reports to be working to apply for disability, previously denied and attempting again and shares for an Uncle to be supporting in this. Shares would like to work but concerned of this effecting his disability but exploring a parttime job opportunity. Explored with therapist factors he thinks would support in reducing feelings of depression. Reports to be medication compliant and explores with therapist behavioral changes and use of coping skills and enjoyable activities to support in increase level of functioning. Agrees to explore engagement in recreation centers to engage in playing basket ball. Engages in education on PTSD and sxs. With score of 58 on PCL5. Agrees to work on attending to daily self-care and exploring behavioral changes and engagement in coping skillsto  support in management of sxs.   Suicidal/Homicidal: Nowithout intent/plan  Therapist Response: Therapist engaged Presenter, broadcasting in therapy session. Completed check in and assessed for current level of functioning, sxs management and level of stressors. Provided suportive feedback; validated feelings. Provided safe space for Randy Stone to share concerns and reported stressors. Engaged Randy Stone in exploring factors contributing to feelings of depression and changed he would like to see in his life. Supported in processing thoughts of living with cousin and not feeling welcomed. Explored desire for disability vs. Work and educated on VR and supported employment services. Encouraged engaging in activities throughout the day vs. Sitting in a car all day allowing self to engage in overthinknig behaviors. Encouraged medication compliance and use of enjoyable activities to engage in. Educated on PTSD dx, provided worksheet and provided PCL to be completed; reviewed scores. Educated on ability to engage in trauma work provided increased stability in depressive sxs. Reviewed session and scheduled follow up. No safety concerns noted.   Plan: Return again in  x 2 weeks.  Diagnosis: Severe major depression, single episode, without psychotic features ([place])  Post traumatic stress disorder  Collaboration of Care: Other None  Patient/Guardian was advised Release of Information must be obtained prior to any record release in order to collaborate their care with an outside provider. Patient/Guardian was advised if they have not already done so to contact the registration department to sign all necessary forms in order for [place] to release information regarding their care.   Consent: Patient/Guardian gives verbal consent for treatment and assignment of benefits for services provided during this visit. Patient/Guardian expressed understanding and agreed to proceed.   Marion Downer, [facility]  06/24/2022  

## 2022-06-25 NOTE — Plan of Care (Signed)
  Problem: Depression CCP Problem  1 MDD severe  Goal: LTG: Randy Stone WILL SCORE LESS THAN 10 ON THE PATIENT HEALTH QUESTIONNAIRE (PHQ-9) Outcome: Initial Goal: STG: Randy Stone decrease sxs of depression AEB development of x 3 effective coping skills within the next 6 months  Outcome: Initial Goal: STG: Randy Stone will decrease sxs of depression AEB ability to reframe maladaptive thinking patterns 7/7 days within the next 6 months  Outcome: Initial   Problem: PTSD-Trauma Disorder CCP Problem  1 PTSD Goal: LTG: Randy Stone will Reduce frequency, intensity, and duration of PTSD symptoms so daily functioning is improved AEB PCL score of 19 or less  Outcome: Initial Goal: STG: Randy Stone will effectively process thoughts and feelings related to past traumas AEB ability to identify and reframe trauma stuck points within the next 6 months  Outcome: Initial Goal: STG: Randy Stone will reduce feelings of anxiety AEB development of x 3 effective grounding and relaxation coping skills within the next 6 months  Outcome: Initial
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
## 2022-07-04 ENCOUNTER — Ambulatory Visit (HOSPITAL_COMMUNITY): Payer: Medicaid Other | Admitting: Mental Health

## 2022-07-04 DIAGNOSIS — F322 Major depressive disorder, single episode, severe without psychotic features: Secondary | ICD-10-CM

## 2022-07-04 DIAGNOSIS — F4310 Post-traumatic stress disorder, unspecified: Secondary | ICD-10-CM

## 2022-07-04 NOTE — Progress Notes (Signed)
  Pt. Connected to caregility for appointment and reported in need of rescheduling due to work. Provided rescheduled appointment. No safety concerns reported.
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
## 2022-07-08 ENCOUNTER — Encounter (HOSPITAL_COMMUNITY): Payer: Medicaid Other | Admitting: Psychiatry

## 2022-07-08 ENCOUNTER — Ambulatory Visit (HOSPITAL_COMMUNITY): Payer: Medicaid Other | Admitting: Mental Health

## 2022-07-22 ENCOUNTER — Ambulatory Visit (INDEPENDENT_AMBULATORY_CARE_PROVIDER_SITE_OTHER): Payer: Medicaid Other | Admitting: Mental Health

## 2022-07-22 DIAGNOSIS — F322 Major depressive disorder, single episode, severe without psychotic features: Secondary | ICD-10-CM | POA: Diagnosis not present

## 2022-07-22 DIAGNOSIS — F4310 Post-traumatic stress disorder, unspecified: Secondary | ICD-10-CM

## 2022-07-22 NOTE — Progress Notes (Unsigned)
   THERAPIST PROGRESS NOTE Virtual Visit via Video Note  I connected with Randy Stone on 07/22/22 at  4:30 PM EDT by a video enabled telemedicine application and verified that I am speaking with the correct person using two identifiers.  Location: Patient: work Provider: office   I discussed the limitations of evaluation and management by telemedicine and the availability of in person appointments. The patient expressed understanding and agreed to proceed.   I discussed the assessment and treatment plan with the patient. The patient was provided an opportunity to ask questions and all were answered. The patient agreed with the plan and demonstrated an understanding of the instructions.   The patient was advised to call back or seek an in-person evaluation if the symptoms worsen or if the condition fails to improve as anticipated.  I provided 30 minutes of non-face-to-face time during this encounter.   Randy Stone, [facility]   Session Time:  4:33pm ( 30 minutes)  Participation Level: Active  Behavioral Response: CasualAlertNeutral  Type of Therapy: Individual Therapy  Treatment Goals addressed: STG: Randy Stone will decrease sxs of depression AEB ability to reframe maladaptive thinking patterns 7/7 days within the next 6 months   STG: Randy Stone decrease sxs of depression AEB development of x 3 effective coping skills within the next 6 months   ProgressTowards Goals: Progressing  Interventions: CBT and Supportive  Summary: Randy Stone is a 21 y.o. male who presents with dx of severe major depression and PTSD. Randy Stone presents to session alert and oriented; mood and affect low; depressed. Speech clear and coherent at normal rate and tone. Reduced spontaneous content.  Suicidal/Homicidal: Nowithout intent/plan  Therapist Response:  Therapist engaged Presenter, broadcasting in therapy session. Completed check in and assessed for current level of functioning, sxs management and level of  stressors. Provided suportive feedback; validated feelings.   Plan: Return again in  x2  weeks.  Diagnosis: Severe major depression, single episode, without psychotic features (Randy Stone)  Post traumatic stress disorder  Collaboration of Care: Other None  Patient/Guardian was advised Release of Information must be obtained prior to any record release in order to collaborate their care with an outside provider. Patient/Guardian was advised if they have not already done so to contact the registration department to sign all necessary forms in order for [place] to release information regarding their care.   Consent: Patient/Guardian gives verbal consent for treatment and assignment of benefits for services provided during this visit. Patient/Guardian expressed understanding and agreed to proceed.   Randy Stone, [facility] 07/22/2022

## 2022-07-22 NOTE — Plan of Care (Signed)
  Problem: Depression CCP Problem  1 MDD severe  Goal: LTG: Real WILL SCORE LESS THAN 10 ON THE PATIENT HEALTH QUESTIONNAIRE (PHQ-9) Outcome: Progressing Goal: STG: Randy Stone decrease sxs of depression AEB development of x 3 effective coping skills within the next 6 months  Outcome: Progressing Goal: STG: Randy Stone will decrease sxs of depression AEB ability to reframe maladaptive thinking patterns 7/7 days within the next 6 months  Outcome: Progressing   Problem: PTSD-Trauma Disorder CCP Problem  1 PTSD Goal: LTG: Randy Stone will Reduce frequency, intensity, and duration of PTSD symptoms so daily functioning is improved AEB PCL score of 19 or less  Outcome: Not Progressing Goal: STG: Randy Stone will effectively process thoughts and feelings related to past traumas AEB ability to identify and reframe trauma stuck points within the next 6 months  Outcome: Not Progressing Goal: STG: Randy Stone will reduce feelings of anxiety AEB development of x 3 effective grounding and relaxation coping skills within the next 6 months  Outcome: Progressing

## 2022-08-06 ENCOUNTER — Telehealth (HOSPITAL_COMMUNITY): Payer: Self-pay | Admitting: Mental Health

## 2022-08-08 ENCOUNTER — Ambulatory Visit (INDEPENDENT_AMBULATORY_CARE_PROVIDER_SITE_OTHER): Payer: Medicaid Other | Admitting: Mental Health

## 2022-08-08 DIAGNOSIS — F4310 Post-traumatic stress disorder, unspecified: Secondary | ICD-10-CM

## 2022-08-08 DIAGNOSIS — F322 Major depressive disorder, single episode, severe without psychotic features: Secondary | ICD-10-CM | POA: Diagnosis not present
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
## 2022-08-11 NOTE — Plan of Care (Signed)
  Problem: Depression CCP Problem  1 MDD severe  Goal: LTG: Randy Stone WILL SCORE LESS THAN 10 ON THE PATIENT HEALTH QUESTIONNAIRE (PHQ-9) Outcome: Progressing Goal: STG: Randy Stone decrease sxs of depression AEB development of x 3 effective coping skills within the next 6 months  Outcome: Progressing Goal: STG: Randy Stone will decrease sxs of depression AEB ability to reframe maladaptive thinking patterns 7/7 days within the next 6 months  Outcome: Progressing   Problem: PTSD-Trauma Disorder CCP Problem  1 PTSD Goal: LTG: Randy Stone will Reduce frequency, intensity, and duration of PTSD symptoms so daily functioning is improved AEB PCL score of 19 or less  Outcome: Progressing Goal: STG: Randy Stone will effectively process thoughts and feelings related to past traumas AEB ability to identify and reframe trauma stuck points within the next 6 months  Outcome: Progressing Goal: STG: Randy Stone will reduce feelings of anxiety AEB development of x 3 effective grounding and relaxation coping skills within the next 6 months  Outcome: Progressing

## 2022-08-13 ENCOUNTER — Encounter (HOSPITAL_COMMUNITY): Payer: Self-pay | Admitting: Psychiatry

## 2022-08-13 ENCOUNTER — Telehealth (INDEPENDENT_AMBULATORY_CARE_PROVIDER_SITE_OTHER): Payer: Medicaid Other | Admitting: Psychiatry

## 2022-08-13 DIAGNOSIS — F322 Major depressive disorder, single episode, severe without psychotic features: Secondary | ICD-10-CM | POA: Diagnosis not present

## 2022-08-13 DIAGNOSIS — F4310 Post-traumatic stress disorder, unspecified: Secondary | ICD-10-CM

## 2022-08-13 MED ORDER — SERTRALINE HCL 50 MG PO TABS: 50.0000 mg | ORAL_TABLET | Freq: Every day | ORAL | 2 refills | Status: AC

## 2022-08-13 MED ORDER — PRAZOSIN HCL 1 MG PO CAPS: 1.0000 mg | ORAL_CAPSULE | Freq: Every evening | ORAL | 2 refills | Status: AC
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
## 2022-08-13 NOTE — Patient Instructions (Signed)
Thank you for attending your appointment today.  -- We did not make any medication changes today. Please continue medications as prescribed.  Please do not make any changes to medications without first discussing with your provider. If you are experiencing a psychiatric emergency, please call 911 or present to your nearest emergency department. Additional crisis, medication management, and therapy resources are included below.  [facility]  [address], [place], [place] [address] [phone] WALK-IN URGENT CARE 24/7 FOR ANYONE [address], [place], [place]  [phone] Fax: [phone] [email or web address] *Interpreters available *Accepts all insurance and uninsured for Urgent Care needs *Accepts Medicaid and uninsured for outpatient treatment (below)      ONLY FOR [place] Residents  Below:    Outpatient New Patient Assessment/Therapy Walk-ins:        Monday -Thursday 8am until slots are full.        Every Friday 1pm-4pm  (first come, first served)                   New Patient Psychiatry/Medication Management        Monday-Friday 8am-11am (first come, first served)               For all walk-ins we ask that you arrive by 7:15am, because patients will be seen in the order of arrival.   

## 2022-08-25 ENCOUNTER — Ambulatory Visit (HOSPITAL_COMMUNITY): Payer: Medicaid Other | Admitting: Mental Health

## 2022-08-25 ENCOUNTER — Telehealth (HOSPITAL_COMMUNITY): Payer: Self-pay | Admitting: Mental Health

## 2022-08-25 NOTE — Telephone Encounter (Signed)
Pt connected to Caregility for appointment and reported to have started a new position for work. Reports would need afternoon appointments. Therapist rescheduled 12/14 @ 4:30pm

## 2022-09-09 ENCOUNTER — Telehealth (HOSPITAL_COMMUNITY): Payer: Self-pay | Admitting: Psychiatry

## 2022-09-11 ENCOUNTER — Ambulatory Visit (INDEPENDENT_AMBULATORY_CARE_PROVIDER_SITE_OTHER): Payer: Medicaid Other | Admitting: Mental Health

## 2022-09-11 DIAGNOSIS — F4310 Post-traumatic stress disorder, unspecified: Secondary | ICD-10-CM

## 2022-09-11 DIAGNOSIS — F322 Major depressive disorder, single episode, severe without psychotic features: Secondary | ICD-10-CM

## 2022-09-11 NOTE — Progress Notes (Unsigned)
THERAPIST PROGRESS NOTE Virtual Visit via Video Note  I connected with Randy Stone on 09/11/22 at  4:30 PM EST by a video enabled telemedicine application and verified that I am speaking with the correct person using two identifiers.  Location: Patient: home Provider: home office    I discussed the limitations of evaluation and management by telemedicine and the availability of in person appointments. The patient expressed understanding and agreed to proceed.  I discussed the assessment and treatment plan with the patient. The patient was provided an opportunity to ask questions and all were answered. The patient agreed with the plan and demonstrated an understanding of the instructions.   The patient was advised to call back or seek an in-person evaluation if the symptoms worsen or if the condition fails to improve as anticipated.  I provided 40 minutes of non-face-to-face time during this encounter.   Randy Stone, [facility]   Session Time: 4:30pm (40 minutes)   Participation Level: Minimal  Behavioral Response: CasualAlertDysphoric  Type of Therapy: Individual Therapy  Treatment Goals addressed: STG: Randy Stone will decrease sxs of depression AEB ability to reframe maladaptive thinking patterns 7/7 days within the next 6 months    STG: Randy Stone decrease sxs of depression AEB development of x 3 effective coping skills within the next 6 months    STG: Randy Stone will reduce feelings of anxiety AEB development of x 3 effective grounding and relaxation coping skills within the next 6 months   ProgressTowards Goals: Progressing  Interventions: CBT and Supportive  Summary: Randy Stone is a 21 y.o. male who presents with dx of severe major depression and PTSD. Randy Stone presents to session alert and oriented; mood and affect neutral; adequate. Speech clear and coherent at normal rate and tone. Engaged and receptive to interventions. Randy Stone shares for moods to have been stable  and reports decreased feelings of depression and anxiety. Denies nightmares and flashbacks. Shares mood improvement with feelings of lethargy secondary to work. Depression and anxiety rated 4/10 with 10 the worse. 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 Shares working to maintain medication compliance with days in which he may forget to take medications. Shares ongoing use of coping skill for anxiety and depression with playing basketball and shares working supports as well. Shares for thinking process to be healthy and denies presence of maladaptive thinking behaviors. States to no longer live with family and to currently live with girlfriend in a property her mother owns. Plans to continue to find his own housing and to be saving money. Denies to have driver's license currently and plans to obtain license and car. Explored with therapist engagement in higher level of care of services with community support team and agrees to try if therapist is able to get him connected. Reports to have filled ROI for uncle and does not have any concerns with uncle having access to his medical records and supporting him. Denies SI/HI/AVH.  Making progress with goals; would benefit from higher level of care due to array of impairments in housing, self-direction and maintaining employment.   Suicidal/Homicidal: Nowithout intent/plan  Therapist Response: Therapist engaged Randy Stone in [employer]. Completed check in and assessed for current level of functioning, sxs management and level of stressors. Provided suportive feedback; validated feelings. Engaged Randy Stone ability to manage feelings of depression and anxiety and explored use of coping skills, behavioral changes and reframing thinking process. Assessed for medication compliance and ability to follow  through with needed tasks for stability in community. Educated on [employer] and benefits of engagement. Assessed for safety;  reviewed session and provided follow up.   Plan: Return again in  x 4(due to holiday)  weeks.  Diagnosis: Post traumatic stress disorder  Severe major depression, single episode, without psychotic features (HCC)  Collaboration of Care: Other None  Patient/Guardian was advised Release of Information must be obtained prior to any record release in order to collaborate their care with an outside provider. Patient/Guardian was advised if they have not already done so to contact the registration department to sign all necessary forms in order for [place] to release information regarding their care.   Consent: Patient/Guardian gives verbal consent for treatment and assignment of benefits for services provided during this visit. Patient/Guardian expressed understanding and agreed to proceed.   Randy Stone, [facility] 09/11/2022

## 2022-09-12 NOTE — Telephone Encounter (Signed)
Therapist returned call from Donney Rankins now on file). Roe Coombs reported for pt to have been diagnosed with PTSD, MDD and ADHD and had filed for SSI disability. Stated records were requested. Therapist confirmed seeing information scanned in from request with electronic signature indicated by pt. Neville Route thanked therapist for return call.

## 2022-10-14 ENCOUNTER — Encounter (HOSPITAL_COMMUNITY): Payer: Medicaid Other | Admitting: Psychiatry

## 2022-10-16 ENCOUNTER — Ambulatory Visit (HOSPITAL_COMMUNITY): Payer: Medicaid Other | Admitting: Mental Health

## 2022-10-16 DIAGNOSIS — F4310 Post-traumatic stress disorder, unspecified: Secondary | ICD-10-CM

## 2022-10-16 DIAGNOSIS — F322 Major depressive disorder, single episode, severe without psychotic features: Secondary | ICD-10-CM

## 2022-10-17 ENCOUNTER — Telehealth (HOSPITAL_COMMUNITY): Payer: Self-pay | Admitting: Mental Health

## 2022-10-17 NOTE — Telephone Encounter (Signed)
Front desk reported pt uncle - Jaclynn Major (ROI) request return call from therapist. Therapist returned call in which Mr. Malen Gauze shared for pt to have been denied disability based on [place] concerns and shares plan to obtain lawyer. Requested copy records stating mh dx present. Therapist provided contact information for medical records.

## 2022-10-17 NOTE — Progress Notes (Signed)
Therapist connected to pt via [place]. Therapist completed check in. Pt reported to be doing well and reported ongoing sxs stability. Continues to work and engage in enjoyable activities. Shares plans of ability to secure vehicle soon and smiles. Therapist unable to complete session due to personal manner. Therapist assessed for safety and rescheduled appointment.

## 2022-10-29 ENCOUNTER — Telehealth (HOSPITAL_COMMUNITY): Payer: Self-pay | Admitting: Mental Health

## 2022-10-29 NOTE — Telephone Encounter (Signed)
Therapist received communication  on 10/28/22 from front desk pt uncle wanted to speak with therapist. ROI on file. Therapist returned call; no answer and unable to leave voicemail due to VM not set up at this time.

## 2022-11-06 ENCOUNTER — Telehealth (HOSPITAL_COMMUNITY): Payer: Self-pay

## 2022-11-06 NOTE — Telephone Encounter (Signed)
Advice - Telephone call with patient's uncle after he requested a call back from this Clinical Manager to discuss why his nephew, patient may have been turned down for disability. Informed collateral that often patients are denied their first time applying for disability, particularly if the person is still trying to work but that if they felt patient was still in need of this, they could appeal the decision.  Informed patient's uncle this was not  uncommon to have to go through an appeals process, often even more than one appeal for someone to be found disabled and they had the right to initiate that appeal.  Informed also if they requested medical records from patient's care, then our medical records department would send those out with releases received and collateral stated understanding and also understanding of the appeals process for patients applying for SSI disability benefits.  Collateral stated no other questions this date and thanked this Midwife for calling him back to explain that process.

## 2022-11-10 ENCOUNTER — Encounter (HOSPITAL_COMMUNITY): Payer: Self-pay

## 2022-11-10 ENCOUNTER — Telehealth (HOSPITAL_COMMUNITY): Payer: Self-pay | Admitting: Mental Health

## 2022-11-10 ENCOUNTER — Ambulatory Visit (HOSPITAL_COMMUNITY): Payer: Medicaid Other | Admitting: Mental Health

## 2022-11-10 NOTE — Telephone Encounter (Signed)
Therapist sent link for tele-therapy session No response after x 10 minutes. Called pt; no answer. Left HIPAA compliant vm

## 2023-07-31 ENCOUNTER — Telehealth (HOSPITAL_COMMUNITY): Payer: Self-pay | Admitting: Psychiatry
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
## 2023-08-24 ENCOUNTER — Encounter (HOSPITAL_COMMUNITY): Payer: Self-pay

## 2023-08-24 ENCOUNTER — Telehealth (HOSPITAL_COMMUNITY): Payer: Medicaid Other | Admitting: Psychiatry

## 2023-10-07 ENCOUNTER — Telehealth (HOSPITAL_COMMUNITY): Payer: Self-pay | Admitting: Psychiatry

## 2024-04-13 ENCOUNTER — Encounter (HOSPITAL_COMMUNITY): Payer: Self-pay | Admitting: Mental Health

## 2024-04-13 NOTE — Telephone Encounter (Signed)
 1:26pm 04/13/2024  Therapist received message from desk on 04/11/2024; stating pt uncle requested call from therapist. Upon review of chart no active release of information is present and unable to contact party.

## 2024-09-12 ENCOUNTER — Emergency Department (HOSPITAL_BASED_OUTPATIENT_CLINIC_OR_DEPARTMENT_OTHER)
Admission: EM | Admit: 2024-09-12 | Discharge: 2024-09-12 | Disposition: A | Discharge status: Home / Self Care | Attending: Emergency Medicine | Admitting: Emergency Medicine

## 2024-09-12 ENCOUNTER — Other Ambulatory Visit: Payer: Self-pay

## 2024-09-12 ENCOUNTER — Emergency Department (HOSPITAL_BASED_OUTPATIENT_CLINIC_OR_DEPARTMENT_OTHER): Admitting: Radiology

## 2024-09-12 DIAGNOSIS — M25571 Pain in right ankle and joints of right foot: Secondary | ICD-10-CM

## 2024-09-12 DIAGNOSIS — Y92410 Unspecified street and highway as the place of occurrence of the external cause: Secondary | ICD-10-CM | POA: Diagnosis not present
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
## 2024-09-12 NOTE — Discharge Instructions (Signed)
 You have no new fractures of your ankle.   Please use Tylenol  or ibuprofen  for pain.  You may use 600 mg ibuprofen  every 6 hours or 1000 mg of Tylenol  every 6 hours.  You may choose to alternate between the 2.  This would be most effective.  Not to exceed 4 g of Tylenol  within 24 hours.  Not to exceed 3200 mg ibuprofen  24 hours.
